# Patient Record
Sex: Male | Born: 1950 | Race: White | Hispanic: No | Smoking: Current some day smoker
Health system: Southern US, Community
[De-identification: ages and names within clinical notes are randomized; demographics above are authoritative.]

## PROBLEM LIST (undated history)

## (undated) HISTORY — PX: ANKLE RECONSTRUCTION: SHX1151

---

## 2018-02-08 ENCOUNTER — Emergency Department: Payer: Self-pay

## 2018-02-08 ENCOUNTER — Other Ambulatory Visit: Payer: Self-pay

## 2018-02-08 ENCOUNTER — Emergency Department
Admission: EM | Admit: 2018-02-08 | Discharge: 2018-02-08 | Disposition: A | Discharge status: Home / Self Care | Payer: Self-pay | Attending: Emergency Medicine | Admitting: Emergency Medicine

## 2018-02-08 DIAGNOSIS — J189 Pneumonia, unspecified organism: Secondary | ICD-10-CM

## 2018-02-08 DIAGNOSIS — J181 Lobar pneumonia, unspecified organism: Secondary | ICD-10-CM | POA: Insufficient documentation

## 2018-02-08 LAB — BASIC METABOLIC PANEL
ANION GAP: 8 (ref 5–15)
Anion gap: 7 (ref 5–15)
BUN: 25 mg/dL — ABNORMAL HIGH (ref 6–20)
BUN: 24 mg/dL — ABNORMAL HIGH (ref 6–20)
CALCIUM: 8.3 mg/dL — AB (ref 8.9–10.3)
CHLORIDE: 98 mmol/L — AB (ref 101–111)
CHLORIDE: 100 mmol/L — AB (ref 101–111)
CO2: 25 mmol/L (ref 22–32)
CO2: 21 mmol/L — ABNORMAL LOW (ref 22–32)
Calcium: 7.6 mg/dL — ABNORMAL LOW (ref 8.9–10.3)
Creatinine, Ser: 1.4 mg/dL — ABNORMAL HIGH (ref 0.61–1.24)
Creatinine, Ser: 1.13 mg/dL (ref 0.61–1.24)
GFR calc Af Amer: >60 mL/min (ref >60)
GFR calc non Af Amer: 52 mL/min — ABNORMAL LOW (ref >60)
GFR, EST AFRICAN AMERICAN: 60 mL/min — AB (ref >60)
GLUCOSE: 105 mg/dL — AB (ref 65–99)
GLUCOSE: 95 mg/dL (ref 65–99)
POTASSIUM: 3.4 mmol/L — AB (ref 3.5–5.1)
Potassium: 3.7 mmol/L (ref 3.5–5.1)
Sodium: 130 mmol/L — ABNORMAL LOW (ref 135–145)
Sodium: 129 mmol/L — ABNORMAL LOW (ref 135–145)

## 2018-02-08 LAB — CBC
HCT: 40.6 % (ref 40.0–52.0)
HEMOGLOBIN: 13.9 g/dL (ref 13.0–18.0)
MCH: 33 pg (ref 26.0–34.0)
MCHC: 34.2 g/dL (ref 32.0–36.0)
MCV: 96.5 fL (ref 80.0–100.0)
Platelets: 256 10*3/uL (ref 150–440)
RBC: 4.2 MIL/uL — AB (ref 4.40–5.90)
RDW: 12.8 % (ref 11.5–14.5)
WBC: 6.8 10*3/uL (ref 3.8–10.6)

## 2018-02-08 LAB — TROPONIN I

## 2018-02-08 MED ORDER — DOXYCYCLINE HYCLATE 100 MG PO CAPS: 100.0000 mg | ORAL_CAPSULE | Freq: Two times a day (BID) | ORAL | 0 refills | Status: AC

## 2018-02-08 MED ORDER — AMOXICILLIN-POT CLAVULANATE 875-125 MG PO TABS: 1.0000 | ORAL_TABLET | Freq: Two times a day (BID) | ORAL | 0 refills | Status: AC

## 2018-02-08 MED ORDER — SODIUM CHLORIDE 0.9 % IV BOLUS
1000.0000 mL | Freq: Once | INTRAVENOUS | Status: AC
  Administered 2018-02-08: 1000 mL via INTRAVENOUS

## 2018-02-08 MED ORDER — SODIUM CHLORIDE 0.9 % IV SOLN
1.0000 g | Freq: Once | INTRAVENOUS | Status: AC
  Administered 2018-02-08: 1 g via INTRAVENOUS
  Filled 2018-02-08: qty 10

## 2018-02-08 MED ORDER — DOXYCYCLINE HYCLATE 100 MG PO TABS
100.0000 mg | ORAL_TABLET | Freq: Once | ORAL | Status: AC
  Administered 2018-02-08: 100 mg via ORAL
  Filled 2018-02-08: qty 1

## 2018-02-08 NOTE — ED Triage Notes (Signed)
Pt c/o increased SOB with cough and congestion for the past 2 weeks., pt states he has had watery diarrhea also.Marland Kitchen Pt c/o having tender abd muscles from all the coughing.Marland Kitchen

## 2018-02-08 NOTE — ED Provider Notes (Signed)
[facility] Emergency Department Provider Note  ____________________________________________  Time seen: Approximately 9:23 PM  I have reviewed the triage vital signs and the nursing notes.   HISTORY  Chief Complaint Shortness of Breath and Diarrhea   HPI Robert Woodward is a 67 y.o. male with h/o smoking who presents for evaluation of SOB, cough, and diarrhea. Patient reports 2 weeks of cough productive of white phlegm and shortness of breath which is present with exertion and resolves at rest. Patient reports feeling very weak and fatigued.  No shortness of breath at rest.  Patient reports having fever several days ago but not in the last few days.  Also has had several daily episodes of watery diarrhea x 2 weeks.  He reports nausea but no vomiting, no chest pain, no abdominal pain, no melena, no hematemesis or hematochezia.  No recent antibiotic use or history of C. difficile.  Patient recently moved here from [place].  He has not seen a doctor for most of his adult life.  Allergies Benadryl [diphenhydramine]  FH Heart failure - father  Social History Social History   Tobacco Use  . Smoking status: Not on file  Substance Use Topics  . Alcohol use: Not on file  . Drug use: Not on file    Review of Systems  Constitutional: + fever, generalized weakness Eyes: Negative for visual changes. ENT: Negative for sore throat. Neck: No neck pain  Cardiovascular: Negative for chest pain. Respiratory: + shortness of breath, cough Gastrointestinal: Negative for abdominal pain, vomiting. + diarrhea. Genitourinary: Negative for dysuria. Musculoskeletal: Negative for back pain. Skin: Negative for rash. Neurological: Negative for headaches, weakness or numbness. Psych: No SI or HI  ____________________________________________   PHYSICAL EXAM:  VITAL SIGNS: ED Triage Vitals  Enc Vitals Group     BP 02/08/18 1612 (!) 147/69     Pulse Rate 02/08/18 1612  95     Resp 02/08/18 1612 17     Temp 02/08/18 1612 99.1 F (37.3 C)     Temp Source 02/08/18 1612 Oral     SpO2 02/08/18 1612 93 %     Weight 02/08/18 1613 215 lb (97.5 kg)     Height 02/08/18 1613 5\' 11"  (1.803 m)     Head Circumference --      Peak Flow --      Pain Score 02/08/18 1612 0     Pain Loc --      Pain Edu? --      Excl. in [place]? --     Constitutional: Alert and oriented. Well appearing and in no apparent distress. HEENT:      Head: Normocephalic and atraumatic.         Eyes: Conjunctivae are normal. Sclera is non-icteric.       Mouth/Throat: Mucous membranes are moist.       Neck: Supple with no signs of meningismus. Cardiovascular: Regular rate and rhythm. No murmurs, gallops, or rubs. 2+ symmetrical distal pulses are present in all extremities. No JVD. Respiratory: Normal respiratory effort. Lungs are clear to auscultation bilaterally with crackles on the L base, no wheezes. Gastrointestinal: Soft, non tender, and non distended with positive bowel sounds. No rebound or guarding. Musculoskeletal: Nontender with normal range of motion in all extremities. No edema, cyanosis, or erythema of extremities. Neurologic: Normal speech and language. Face is symmetric. Moving all extremities. No gross focal neurologic deficits are appreciated. Skin: Skin is warm, dry and intact. No rash noted. Psychiatric: Mood and  affect are normal. Speech and behavior are normal.  ____________________________________________   LABS (all labs ordered are listed, but only abnormal results are displayed)  Labs Reviewed  BASIC METABOLIC PANEL - Abnormal; Notable for the following components:      Result Value   Sodium 130 (*)    Chloride 98 (*)    Glucose, Bld 105 (*)    BUN 25 (*)    Creatinine, Ser 1.40 (*)    Calcium 8.3 (*)    GFR calc non Af Amer 52 (*)    GFR calc Af Amer 60 (*)    All other components within normal limits  CBC - Abnormal; Notable for the following components:    RBC 4.20 (*)    All other components within normal limits  BASIC METABOLIC PANEL - Abnormal; Notable for the following components:   Sodium 129 (*)    Potassium 3.4 (*)    Chloride 100 (*)    CO2 21 (*)    BUN 24 (*)    Calcium 7.6 (*)    All other components within normal limits  TROPONIN I   ____________________________________________  EKG  ED ECG REPORT I, Rudene Re, the attending physician, personally viewed and interpreted this ECG.  Normal sinus rhythm, rate of 91, normal intervals, normal QRS and QTC, normal axis, no ST elevations or depressions.  Normal EKG.  No prior for comparison. ____________________________________________  RADIOLOGY  I have personally reviewed the images performed during this visit and I agree with the Radiologist's read.   Interpretation by Radiologist:  Dg Chest 2 View  Result Date: 02/08/2018 CLINICAL DATA:  Increased shortness of breath with cough and congestion over the past 2 weeks. EXAM: CHEST - 2 VIEW COMPARISON:  None. FINDINGS: The heart size and mediastinal contours are within normal limits. Normal pulmonary vascularity. Patchy consolidation in the left lower lobe. No pleural effusion or pneumothorax. No acute osseous abnormality. IMPRESSION: 1. Patchy consolidation in the left lower lobe, suspicious for pneumonia. Followup PA and lateral chest X-ray is recommended in 3-4 weeks following trial of antibiotic therapy to ensure resolution and exclude underlying malignancy. Electronically Signed   By: Titus Dubin M.D.   On: 02/08/2018 16:40     ____________________________________________   PROCEDURES  Procedure(s) performed: None Procedures Critical Care performed:  None ____________________________________________   INITIAL IMPRESSION / ASSESSMENT AND PLAN / ED COURSE   67 y.o. male with h/o smoking who presents for evaluation of SOB, cough, fatigue, fever, and diarrhea x 2 weeks.  Patient is well-appearing, no  distress, vital signs show low-grade temp of 99.1 but otherwise within normal limits, lung auscultation reveals crackles on the left basis, normal work of breathing and normal sats.  Chest x-ray concerning for left lower lobe pneumonia.  Labs showing normal white count of 6.8.  Creatinine of 1.40 with a GFR of 52.  Patient has no history of kidney dysfunction.  Troponin is within normal limits.  EKG with no evidence of ischemia.  Will give IV fluids, Rocephin and doxycycline for community-acquired pneumonia.  Will recheck creatinine and if that is improving plan to discharge home and referred to primary care doctor.    _________________________ 11:09 PM on 02/08/2018 -----------------------------------------  Creatinine improved with IVF. Patient remains extremely well appearing with no signs of sepsis. Will dc on augmentin and doxy, close f/u at [facility] clinic. Discussed return precautions with patient.  As part of my medical decision making, I reviewed the following data within the [facility]  Nursing notes reviewed and incorporated, Labs reviewed , EKG interpreted , Radiograph reviewed , Notes from prior ED visits and [place] Controlled Substance Database    Pertinent labs & imaging results that were available during my care of the patient were reviewed by me and considered in my medical decision making (see chart for details).    ____________________________________________   FINAL CLINICAL IMPRESSION(S) / ED DIAGNOSES  Final diagnoses:  Community acquired pneumonia of left lower lobe of lung ([place])      NEW MEDICATIONS STARTED DURING THIS VISIT:  ED Discharge Orders        Ordered    doxycycline (VIBRAMYCIN) 100 MG capsule  2 times daily     02/08/18 2241    amoxicillin-clavulanate (AUGMENTIN) 875-125 MG tablet  2 times daily     02/08/18 2241       Note:  This document was prepared using Dragon voice recognition software and may include unintentional dictation  errors.    Alfred Levins, [place], MD 02/08/18 [phone]

## 2018-08-04 ENCOUNTER — Emergency Department: Payer: Medicare Other

## 2018-08-04 ENCOUNTER — Inpatient Hospital Stay
Admission: EM | Admit: 2018-08-04 | Discharge: 2018-08-11 | DRG: 436 | Disposition: A | Discharge status: Hospice | Payer: Medicare Other | Attending: Internal Medicine | Admitting: Internal Medicine

## 2018-08-04 ENCOUNTER — Other Ambulatory Visit: Payer: Self-pay

## 2018-08-04 DIAGNOSIS — R402144 Coma scale, eyes open, spontaneous, 24 hours or more after hospital admission: Secondary | ICD-10-CM | POA: Diagnosis not present

## 2018-08-04 DIAGNOSIS — E860 Dehydration: Secondary | ICD-10-CM | POA: Diagnosis not present

## 2018-08-04 DIAGNOSIS — R160 Hepatomegaly, not elsewhere classified: Secondary | ICD-10-CM

## 2018-08-04 DIAGNOSIS — R200 Anesthesia of skin: Secondary | ICD-10-CM | POA: Diagnosis present

## 2018-08-04 DIAGNOSIS — E871 Hypo-osmolality and hyponatremia: Secondary | ICD-10-CM

## 2018-08-04 DIAGNOSIS — R402254 Coma scale, best verbal response, oriented, 24 hours or more after hospital admission: Secondary | ICD-10-CM | POA: Diagnosis not present

## 2018-08-04 DIAGNOSIS — Z515 Encounter for palliative care: Secondary | ICD-10-CM

## 2018-08-04 DIAGNOSIS — R7989 Other specified abnormal findings of blood chemistry: Secondary | ICD-10-CM

## 2018-08-04 DIAGNOSIS — K219 Gastro-esophageal reflux disease without esophagitis: Secondary | ICD-10-CM | POA: Diagnosis present

## 2018-08-04 DIAGNOSIS — Z91048 Other nonmedicinal substance allergy status: Secondary | ICD-10-CM

## 2018-08-04 DIAGNOSIS — I4581 Long QT syndrome: Secondary | ICD-10-CM | POA: Diagnosis present

## 2018-08-04 DIAGNOSIS — Z888 Allergy status to other drugs, medicaments and biological substances status: Secondary | ICD-10-CM

## 2018-08-04 DIAGNOSIS — I248 Other forms of acute ischemic heart disease: Secondary | ICD-10-CM | POA: Diagnosis present

## 2018-08-04 DIAGNOSIS — F17210 Nicotine dependence, cigarettes, uncomplicated: Secondary | ICD-10-CM | POA: Diagnosis present

## 2018-08-04 DIAGNOSIS — K766 Portal hypertension: Secondary | ICD-10-CM | POA: Diagnosis present

## 2018-08-04 DIAGNOSIS — R188 Other ascites: Secondary | ICD-10-CM

## 2018-08-04 DIAGNOSIS — R402364 Coma scale, best motor response, obeys commands, 24 hours or more after hospital admission: Secondary | ICD-10-CM | POA: Diagnosis not present

## 2018-08-04 DIAGNOSIS — Z7401 Bed confinement status: Secondary | ICD-10-CM

## 2018-08-04 DIAGNOSIS — Z7189 Other specified counseling: Secondary | ICD-10-CM

## 2018-08-04 DIAGNOSIS — C220 Liver cell carcinoma: Secondary | ICD-10-CM | POA: Diagnosis not present

## 2018-08-04 DIAGNOSIS — E872 Acidosis: Secondary | ICD-10-CM | POA: Diagnosis present

## 2018-08-04 DIAGNOSIS — R600 Localized edema: Secondary | ICD-10-CM | POA: Diagnosis present

## 2018-08-04 DIAGNOSIS — A419 Sepsis, unspecified organism: Secondary | ICD-10-CM | POA: Diagnosis present

## 2018-08-04 DIAGNOSIS — N5089 Other specified disorders of the male genital organs: Secondary | ICD-10-CM | POA: Diagnosis present

## 2018-08-04 DIAGNOSIS — R000 Tachycardia, unspecified: Secondary | ICD-10-CM | POA: Diagnosis present

## 2018-08-04 DIAGNOSIS — M79671 Pain in right foot: Secondary | ICD-10-CM | POA: Diagnosis not present

## 2018-08-04 DIAGNOSIS — K7031 Alcoholic cirrhosis of liver with ascites: Secondary | ICD-10-CM

## 2018-08-04 DIAGNOSIS — B182 Chronic viral hepatitis C: Secondary | ICD-10-CM | POA: Diagnosis present

## 2018-08-04 DIAGNOSIS — D638 Anemia in other chronic diseases classified elsewhere: Secondary | ICD-10-CM | POA: Diagnosis present

## 2018-08-04 DIAGNOSIS — Z560 Unemployment, unspecified: Secondary | ICD-10-CM

## 2018-08-04 DIAGNOSIS — E876 Hypokalemia: Secondary | ICD-10-CM | POA: Diagnosis not present

## 2018-08-04 DIAGNOSIS — N179 Acute kidney failure, unspecified: Secondary | ICD-10-CM | POA: Diagnosis not present

## 2018-08-04 DIAGNOSIS — F1020 Alcohol dependence, uncomplicated: Secondary | ICD-10-CM | POA: Diagnosis present

## 2018-08-04 DIAGNOSIS — Z66 Do not resuscitate: Secondary | ICD-10-CM | POA: Diagnosis present

## 2018-08-04 DIAGNOSIS — R778 Other specified abnormalities of plasma proteins: Secondary | ICD-10-CM

## 2018-08-04 DIAGNOSIS — K5900 Constipation, unspecified: Secondary | ICD-10-CM | POA: Diagnosis present

## 2018-08-04 LAB — LACTIC ACID, PLASMA: Lactic Acid, Venous: 4.4 mmol/L (ref 0.5–1.9)

## 2018-08-04 LAB — COMPREHENSIVE METABOLIC PANEL
ALK PHOS: 83 U/L (ref 38–126)
ALT: 39 U/L (ref 0–44)
AST: 94 U/L — AB (ref 15–41)
Albumin: 2.4 g/dL — ABNORMAL LOW (ref 3.5–5.0)
Anion gap: 12 (ref 5–15)
BUN: 27 mg/dL — ABNORMAL HIGH (ref 8–23)
CO2: 23 mmol/L (ref 22–32)
CREATININE: 1.24 mg/dL (ref 0.61–1.24)
Calcium: 8.2 mg/dL — ABNORMAL LOW (ref 8.9–10.3)
Chloride: 99 mmol/L (ref 98–111)
GFR calc Af Amer: >60 mL/min (ref >60)
GFR calc non Af Amer: 59 mL/min — ABNORMAL LOW (ref >60)
GLUCOSE: 125 mg/dL — AB (ref 70–99)
POTASSIUM: 3.9 mmol/L (ref 3.5–5.1)
Sodium: 134 mmol/L — ABNORMAL LOW (ref 135–145)
Total Bilirubin: 3.4 mg/dL — ABNORMAL HIGH (ref 0.3–1.2)
Total Protein: 6 g/dL — ABNORMAL LOW (ref 6.5–8.1)

## 2018-08-04 LAB — CBC
HEMATOCRIT: 29.3 % — AB (ref 40.0–52.0)
Hemoglobin: 9.7 g/dL — ABNORMAL LOW (ref 13.0–18.0)
MCH: 30.6 pg (ref 26.0–34.0)
MCHC: 33.1 g/dL (ref 32.0–36.0)
MCV: 92.2 fL (ref 80.0–100.0)
Platelets: 254 10*3/uL (ref 150–440)
RBC: 3.18 MIL/uL — AB (ref 4.40–5.90)
RDW: 14.6 % — ABNORMAL HIGH (ref 11.5–14.5)
WBC: 12.2 10*3/uL — ABNORMAL HIGH (ref 3.8–10.6)

## 2018-08-04 LAB — BLOOD GAS, VENOUS
Acid-base deficit: 3.9 mmol/L — ABNORMAL HIGH (ref 0.0–2.0)
Bicarbonate: 21.6 mmol/L (ref 20.0–28.0)
O2 Saturation: 57 %
PATIENT TEMPERATURE: 37
pCO2, Ven: 40 mmHg — ABNORMAL LOW (ref 44.0–60.0)
pH, Ven: 7.34 (ref 7.250–7.430)
pO2, Ven: 32 mmHg (ref 32.0–45.0)

## 2018-08-04 LAB — TROPONIN I: Troponin I: 0.14 ng/mL (ref <0.03)

## 2018-08-04 LAB — BRAIN NATRIURETIC PEPTIDE: B Natriuretic Peptide: 63 pg/mL (ref 0.0–100.0)

## 2018-08-04 MED ORDER — SODIUM CHLORIDE 0.9 % IV BOLUS
1000.0000 mL | Freq: Once | INTRAVENOUS | Status: AC
  Administered 2018-08-04: 1000 mL via INTRAVENOUS

## 2018-08-04 MED ORDER — SODIUM CHLORIDE 0.9 % IV BOLUS
500.0000 mL | Freq: Once | INTRAVENOUS | Status: AC
  Administered 2018-08-04: 500 mL via INTRAVENOUS

## 2018-08-04 MED ORDER — ASPIRIN 81 MG PO CHEW: 324.0000 mg | CHEWABLE_TABLET | Freq: Once | ORAL | Status: DC

## 2018-08-04 MED ORDER — IOPAMIDOL (ISOVUE-300) INJECTION 61%
100.0000 mL | Freq: Once | INTRAVENOUS | Status: AC | PRN
  Administered 2018-08-04: 100 mL via INTRAVENOUS

## 2018-08-04 NOTE — ED Notes (Signed)
Date and time results received: 08/04/18 2123 (use smartphrase ".now" to insert current time)  Test: Lactic Acid  Critical Value: 4.4   Name of Provider Notified: MD Mariea Clonts   Orders Received? Or Actions Taken?: No orders received

## 2018-08-04 NOTE — ED Notes (Signed)
Patient reminded that urine specimen is needed patient unable to void at this time. Patient reported he would alert RN when able to provide a specimen.

## 2018-08-04 NOTE — ED Notes (Signed)
Pt states that he has not been able to ambulate for the past 2 days.

## 2018-08-04 NOTE — ED Notes (Signed)
This RN present for rectal exam for MD Mariea Clonts

## 2018-08-04 NOTE — ED Provider Notes (Addendum)
[facility] Emergency Department Provider Note  ____________________________________________  Time seen: Approximately 8:28 PM  I have reviewed the triage vital signs and the nursing notes.   HISTORY  Chief Complaint Shortness of Breath; Leg Swelling (Generalized edema); and Ascites    HPI Robert Woodward is a 67 y.o. male who does not regularly visit a physician presenting with inability to get out of bed.  The patient reports that for the past several months, he has had a progressively distending abdomen with upper extremity and facial wasting.  In addition, he has had new symmetric bilateral lower extremity edema, scrotal swelling, and progressively worsening dyspnea on exertion.  He denies any chest pain.  He has had decreased exercise tolerance.  He denies any nausea vomiting or diarrhea but has had decreased appetite.  No urinary symptoms, fevers or chills.  Patient was a smoker remotely and occasionally drinks alcohol; he has no personal history of cancer.  History reviewed. No pertinent past medical history.  There are no active problems to display for this patient.       Allergies Benadryl [diphenhydramine] and Wool alcohol [lanolin]  History reviewed. No pertinent family history.  Social History Social History   Tobacco Use  . Smoking status: Not on file  Substance Use Topics  . Alcohol use: Not on file  . Drug use: Not on file    Review of Systems Constitutional: No fever/chills.  Positive inability to get out of bed due to dyspnea and generalized weakness.  Positive muscle wasting in the upper extremities and face. Eyes: No visual changes. ENT: No sore throat. No congestion or rhinorrhea. Cardiovascular: Denies chest pain. Denies palpitations. Respiratory: Positive progressively worsening exertional shortness of breath.  No cough. Gastrointestinal: Positive abdominal distention without focal abdominal pain.  No nausea, no vomiting.  No  diarrhea.  Positive decreased appetite.  No constipation. Genitourinary: Negative for dysuria.  Positive scrotal swelling. Musculoskeletal: Negative for back pain. Skin: Negative for rash.  Positive jaundice. Neurological: Negative for headaches. No focal numbness, tingling or weakness.     ____________________________________________   PHYSICAL EXAM:  VITAL SIGNS: ED Triage Vitals  Enc Vitals Group     BP 08/04/18 1858 108/83     Pulse Rate 08/04/18 1858 (!) 111     Resp 08/04/18 1858 (!) 23     Temp 08/04/18 1858 98.3 F (36.8 C)     Temp Source 08/04/18 1858 Oral     SpO2 08/04/18 1858 99 %     Weight 08/04/18 1905 255 lb 4.8 oz (115.8 kg)     Height 08/04/18 1905 5\' 11"  (1.803 m)     Head Circumference --      Peak Flow --      Pain Score 08/04/18 1858 0     Pain Loc --      Pain Edu? --      Excl. in [place]? --     Constitutional: Alert and oriented.  Answers questions appropriately.  The patient is chronically ill-appearing with temporal wasting, diffuse wasting of the upper extremity muscles, jaundice. Eyes: Conjunctivae are normal.  EOMI. Positive scleral icterus. Head: Atraumatic. Nose: No congestion/rhinnorhea. Mouth/Throat: Mucous membranes are very dry.  Neck: No stridor.  Supple.  Positive JVD.  No meningismus. Cardiovascular: Fast rate, regular rhythm. No murmurs, rubs or gallops.  Respiratory: Tachypneic with accessory muscle use but no retractions.  Lungs CTAB.  No wheezes, rales or ronchi. Gastrointestinal: Soft, and grossly distended with fluid wave.  No focal  tenderness to palpation.  No guarding or rebound.  No peritoneal signs. GU: Positive diffuse scrotal swelling.  Large nonthrombosed nonbleeding hemorrhoid at 6:00, no palpable internal hemorrhoids, with brown stool that is guaiac positive. Musculoskeletal: Positive bilateral symmetric lower extremity edema that is pitting to the mid thighs. Neurologic:  A&Ox3.  Speech is clear.  Face and smile are  symmetric.  EOMI.  Moves all extremities well. Skin:  Skin is warm, dry and intact. No rash noted. Psychiatric: Mood and affect are normal.   ____________________________________________   LABS (all labs ordered are listed, but only abnormal results are displayed)  Labs Reviewed  CBC - Abnormal; Notable for the following components:      Result Value   WBC 12.2 (*)    RBC 3.18 (*)    Hemoglobin 9.7 (*)    HCT 29.3 (*)    RDW 14.6 (*)    All other components within normal limits  TROPONIN I - Abnormal; Notable for the following components:   Troponin I 0.14 (*)    All other components within normal limits  COMPREHENSIVE METABOLIC PANEL - Abnormal; Notable for the following components:   Sodium 134 (*)    Glucose, Bld 125 (*)    BUN 27 (*)    Calcium 8.2 (*)    Total Protein 6.0 (*)    Albumin 2.4 (*)    AST 94 (*)    Total Bilirubin 3.4 (*)    GFR calc non Af Amer 59 (*)    All other components within normal limits  BLOOD GAS, VENOUS - Abnormal; Notable for the following components:   pCO2, Ven 40 (*)    Acid-base deficit 3.9 (*)    All other components within normal limits  LACTIC ACID, PLASMA - Abnormal; Notable for the following components:   Lactic Acid, Venous 4.4 (*)    All other components within normal limits  BRAIN NATRIURETIC PEPTIDE  URINALYSIS, COMPLETE (UACMP) WITH MICROSCOPIC  LACTIC ACID, PLASMA  PROTIME-INR  APTT   ____________________________________________  EKG  ED ECG REPORT I, Anne-Caroline Mariea Clonts, the attending physician, personally viewed and interpreted this ECG.   Date: 08/04/2018  EKG Time: 2124  Rate: 100  Rhythm: sinus tachycardia  Axis: normal  Intervals:prolonged QTc  ST&T Change: No STEMI  ____________________________________________  RADIOLOGY  Dg Chest 2 View  Result Date: 08/04/2018 CLINICAL DATA:  Abdominal distension and diffuse body edema for the past 3 weeks. Dyspnea on exertion. EXAM: CHEST - 2 VIEW COMPARISON:   02/08/2018. FINDINGS: Normal sized heart. Clear lungs. Diffuse osteopenia. Mild thoracic spine degenerative changes. IMPRESSION: No acute abnormality. Electronically Signed   By: Claudie Revering M.D.   On: 08/04/2018 21:04   Ct Abdomen Pelvis W Contrast  Result Date: 08/04/2018 CLINICAL DATA:  Abdominal distension with swelling EXAM: CT ABDOMEN AND PELVIS WITH CONTRAST TECHNIQUE: Multidetector CT imaging of the abdomen and pelvis was performed using the standard protocol following bolus administration of intravenous contrast. CONTRAST:  172mL ISOVUE-300 IOPAMIDOL (ISOVUE-300) INJECTION 61% COMPARISON:  None. FINDINGS: Lower chest: Lung bases demonstrate no acute consolidation or pleural effusion. The heart size is within normal limits. Small moderate hiatal hernia with fluid in the hernia sac. Hepatobiliary: Nodular liver contour consistent with cirrhosis. Multiple suspicious liver masses measuring up to 8.6 cm in size. Calcified gallstones. No biliary dilatation Pancreas: Unremarkable. No pancreatic ductal dilatation or surrounding inflammatory changes. Spleen: Enlarged, coronal measurement of 15.5 cm. Vague hypodensity within the posterior spleen. Adrenals/Urinary Tract: Adrenal glands are within normal  limits. No hydronephrosis. Bladder within normal limits. Stomach/Bowel: No bowel dilatation or bowel wall thickening. Centralization of bowel loops. Negative appendix. Vascular/Lymphatic: Moderate aortic atherosclerosis. No aneurysmal dilatation. Subcentimeter gastrohepatic lymph nodes. Reproductive: Prostate is unremarkable. Other: No free air. Massive ascites in the abdomen and pelvis. Generalized subcutaneous edema Musculoskeletal: Chronic appearing superior endplate deformity at L1 with Schmorl's node. No suspicious bone lesion. IMPRESSION: 1. 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 2. Massive quantity of ascites within the  abdomen and pelvis 3. Splenomegaly. Vague hypodensity in the posterior spleen, not sure if this is due to heterogeneous enhancement or a vague hypodense mass in the spleen. 4. Gallstones 5. Anasarca Electronically Signed   By: Donavan Foil M.D.   On: 08/04/2018 22:20    ____________________________________________   PROCEDURES  Procedure(s) performed: None  Procedures  Critical Care performed: Yes, see critical care note(s) ____________________________________________   INITIAL IMPRESSION / ASSESSMENT AND PLAN / ED COURSE  Pertinent labs & imaging results that were available during my care of the patient were reviewed by me and considered in my medical decision making (see chart for details).  67 y.o. male presenting for inability to get out of bed today, with a progressively worsening course of bilateral lower extremity edema, scrotal edema, abdominal  distention and peripheral weight loss.  Overall, the patient is tachycardic which may be from hypovolemia and I have ordered intravenous fluids; we will start with 500 cc and reevaluate the patient.  I am concerned about malignancy in this patient given his jaundice and markedly distended abdomen, including hepatocellular carcinoma, pancreatic cancer, or gallbladder cancer.  The patient's laboratory studies are pending, including VBG and lactic acid.  We will also get a UTI.  CHF is possible but also less likely as a primary diagnosis today.  The patient will likely require admission to the hospital for further evaluation and treatment.  ----------------------------------------- 11:04 PM on 08/04/2018 -----------------------------------------  The patient CT scan does show a cirrhotic liver with multiple liver masses concerning for metastatic disease.  His bilirubin today is 3.4 and he has a mildly elevated AST at 94.  Additionally, he does have an elevated troponin of 0.14, without any ischemic changes on his EKG.  Given what is likely  malignancy, PE is considered, especially in the setting of a history of shortness of breath and a CT scan has been ordered.  The patient's creatinine today is 1.24, and I will give him additional fluids as this will be his second dye load.  The patient does have an elevated white blood cell count 12 with sinus tachycardia, but no obvious source of infection.  The CT scan with further evaluate for pneumonia, and a urinalysis is still pending.  These abnormalities may represent an acute on chronic illness from malignancy and the pt has been afebrile here and at home.  Patient does have a new anemia, which will be closely monitored; transfusion is not indicated emergently.  CRITICAL CARE Performed by: Eula Listen   Total critical care time: 45 minutes  Critical care time was exclusive of separately billable procedures and treating other patients.  Critical care was necessary to treat or prevent imminent or life-threatening deterioration.  Critical care was time spent personally by me on the following activities: development of treatment plan with patient and/or surrogate as well as nursing, discussions with consultants, evaluation of patient's response to treatment, examination of patient, obtaining history from patient or surrogate, ordering and  performing treatments and interventions, ordering and review of laboratory studies, ordering and review of radiographic studies, pulse oximetry and re-evaluation of patient's condition.    ____________________________________________  FINAL CLINICAL IMPRESSION(S) / ED DIAGNOSES  Final diagnoses:  Hyponatremia  Hyperbilirubinemia  Liver masses  Elevated troponin         NEW MEDICATIONS STARTED DURING THIS VISIT:  New Prescriptions   No medications on file      Eula Listen, MD 08/04/18 0569    Eula Listen, MD 08/04/18 [phone]

## 2018-08-04 NOTE — ED Notes (Signed)
Date and time results received: 08/04/18 2138 (use smartphrase ".now" to insert current time)  Test: Troponin  Critical Value: 0.14  Name of Provider Notified: MD Mariea Clonts   Orders Received? Or Actions Taken?:No orders received

## 2018-08-04 NOTE — ED Triage Notes (Signed)
Pt brought in by EMS. Pt states inc in generalized edema over past few weeks. Abd distented; groin swollen; legs edematous; BG 165 per EMS. Pt came in today because he "couldn't get out of bed because it hurt too bad." States he is dyspneic on exertion.

## 2018-08-04 NOTE — ED Notes (Signed)
Pt c/o numbness to feet and tingling in left thigh

## 2018-08-04 NOTE — ED Notes (Signed)
ED Provider at bedside. 

## 2018-08-04 NOTE — ED Notes (Signed)
Patient transported to CT 

## 2018-08-05 ENCOUNTER — Inpatient Hospital Stay: Payer: Medicare Other

## 2018-08-05 ENCOUNTER — Encounter: Payer: Self-pay | Admitting: *Deleted

## 2018-08-05 ENCOUNTER — Inpatient Hospital Stay
Admit: 2018-08-05 | Discharge: 2018-08-05 | Disposition: A | Discharge status: Home / Self Care | Payer: Medicare Other | Attending: Internal Medicine | Admitting: Internal Medicine

## 2018-08-05 DIAGNOSIS — D638 Anemia in other chronic diseases classified elsewhere: Secondary | ICD-10-CM | POA: Diagnosis present

## 2018-08-05 DIAGNOSIS — B182 Chronic viral hepatitis C: Secondary | ICD-10-CM | POA: Diagnosis present

## 2018-08-05 DIAGNOSIS — K219 Gastro-esophageal reflux disease without esophagitis: Secondary | ICD-10-CM | POA: Diagnosis present

## 2018-08-05 DIAGNOSIS — N179 Acute kidney failure, unspecified: Secondary | ICD-10-CM | POA: Diagnosis not present

## 2018-08-05 DIAGNOSIS — N5089 Other specified disorders of the male genital organs: Secondary | ICD-10-CM

## 2018-08-05 DIAGNOSIS — K766 Portal hypertension: Secondary | ICD-10-CM | POA: Diagnosis present

## 2018-08-05 DIAGNOSIS — A419 Sepsis, unspecified organism: Secondary | ICD-10-CM | POA: Diagnosis present

## 2018-08-05 DIAGNOSIS — F1020 Alcohol dependence, uncomplicated: Secondary | ICD-10-CM | POA: Diagnosis present

## 2018-08-05 DIAGNOSIS — F17210 Nicotine dependence, cigarettes, uncomplicated: Secondary | ICD-10-CM

## 2018-08-05 DIAGNOSIS — R402144 Coma scale, eyes open, spontaneous, 24 hours or more after hospital admission: Secondary | ICD-10-CM | POA: Diagnosis not present

## 2018-08-05 DIAGNOSIS — Z7289 Other problems related to lifestyle: Secondary | ICD-10-CM | POA: Diagnosis not present

## 2018-08-05 DIAGNOSIS — E876 Hypokalemia: Secondary | ICD-10-CM | POA: Diagnosis not present

## 2018-08-05 DIAGNOSIS — Z66 Do not resuscitate: Secondary | ICD-10-CM | POA: Diagnosis present

## 2018-08-05 DIAGNOSIS — Z515 Encounter for palliative care: Secondary | ICD-10-CM

## 2018-08-05 DIAGNOSIS — K7031 Alcoholic cirrhosis of liver with ascites: Secondary | ICD-10-CM | POA: Diagnosis present

## 2018-08-05 DIAGNOSIS — M79671 Pain in right foot: Secondary | ICD-10-CM | POA: Diagnosis not present

## 2018-08-05 DIAGNOSIS — R402364 Coma scale, best motor response, obeys commands, 24 hours or more after hospital admission: Secondary | ICD-10-CM | POA: Diagnosis not present

## 2018-08-05 DIAGNOSIS — E860 Dehydration: Secondary | ICD-10-CM | POA: Diagnosis not present

## 2018-08-05 DIAGNOSIS — R402254 Coma scale, best verbal response, oriented, 24 hours or more after hospital admission: Secondary | ICD-10-CM | POA: Diagnosis not present

## 2018-08-05 DIAGNOSIS — K5900 Constipation, unspecified: Secondary | ICD-10-CM | POA: Diagnosis present

## 2018-08-05 DIAGNOSIS — C220 Liver cell carcinoma: Secondary | ICD-10-CM | POA: Diagnosis present

## 2018-08-05 DIAGNOSIS — R160 Hepatomegaly, not elsewhere classified: Secondary | ICD-10-CM

## 2018-08-05 DIAGNOSIS — E872 Acidosis: Secondary | ICD-10-CM | POA: Diagnosis present

## 2018-08-05 DIAGNOSIS — R600 Localized edema: Secondary | ICD-10-CM | POA: Diagnosis present

## 2018-08-05 DIAGNOSIS — I248 Other forms of acute ischemic heart disease: Secondary | ICD-10-CM | POA: Diagnosis present

## 2018-08-05 DIAGNOSIS — E871 Hypo-osmolality and hyponatremia: Secondary | ICD-10-CM | POA: Diagnosis present

## 2018-08-05 LAB — URINALYSIS, COMPLETE (UACMP) WITH MICROSCOPIC
Bacteria, UA: NONE SEEN
Bilirubin Urine: NEGATIVE
Glucose, UA: NEGATIVE mg/dL
Ketones, ur: NEGATIVE mg/dL
Leukocytes, UA: NEGATIVE
Nitrite: NEGATIVE
Protein, ur: NEGATIVE mg/dL
Specific Gravity, Urine: >1.046 — ABNORMAL HIGH (ref 1.005–1.030)
pH: 5 (ref 5.0–8.0)

## 2018-08-05 LAB — PROTIME-INR
INR: 1.42
Prothrombin Time: 17.2 seconds — ABNORMAL HIGH (ref 11.4–15.2)

## 2018-08-05 LAB — BODY FLUID CELL COUNT WITH DIFFERENTIAL
EOS FL: 0 %
LYMPHS FL: 4 %
Monocyte-Macrophage-Serous Fluid: 18 %
NEUTROPHIL FLUID: 78 %
WBC FLUID: 678 uL

## 2018-08-05 LAB — ALBUMIN, PLEURAL OR PERITONEAL FLUID

## 2018-08-05 LAB — TSH: TSH: 2.194 u[IU]/mL (ref 0.350–4.500)

## 2018-08-05 LAB — TROPONIN I
Troponin I: <0.03 ng/mL (ref <0.03)
Troponin I: 0.04 ng/mL (ref <0.03)
Troponin I: 0.03 ng/mL (ref <0.03)

## 2018-08-05 LAB — LACTIC ACID, PLASMA: Lactic Acid, Venous: 4.7 mmol/L (ref 0.5–1.9)

## 2018-08-05 LAB — APTT: aPTT: 33 seconds (ref 24–36)

## 2018-08-05 LAB — POCT I-STAT CREATININE: Creatinine, Ser: 1.2 mg/dL (ref 0.61–1.24)

## 2018-08-05 MED ORDER — SODIUM CHLORIDE 0.9 % IV SOLN
2.0000 g | Freq: Every day | INTRAVENOUS | Status: DC
  Administered 2018-08-05 – 2018-08-08 (×4): 2 g via INTRAVENOUS
  Filled 2018-08-05 (×3): qty 2
  Filled 2018-08-05: qty 20

## 2018-08-05 MED ORDER — FUROSEMIDE 10 MG/ML IJ SOLN
60.0000 mg | Freq: Four times a day (QID) | INTRAMUSCULAR | Status: AC
  Administered 2018-08-05 (×2): 60 mg via INTRAVENOUS
  Filled 2018-08-05 (×2): qty 6

## 2018-08-05 MED ORDER — ONDANSETRON HCL 4 MG/2ML IJ SOLN
4.0000 mg | Freq: Four times a day (QID) | INTRAMUSCULAR | Status: DC | PRN
  Administered 2018-08-05 – 2018-08-07 (×3): 4 mg via INTRAVENOUS
  Filled 2018-08-05 (×3): qty 2

## 2018-08-05 MED ORDER — ACETAMINOPHEN 650 MG RE SUPP: 650.0000 mg | Freq: Four times a day (QID) | RECTAL | Status: DC | PRN

## 2018-08-05 MED ORDER — SODIUM CHLORIDE 0.9 % IV SOLN
1.0000 g | INTRAVENOUS | Status: DC
  Filled 2018-08-05: qty 10

## 2018-08-05 MED ORDER — PANTOPRAZOLE SODIUM 40 MG PO TBEC
40.0000 mg | DELAYED_RELEASE_TABLET | Freq: Every day | ORAL | Status: DC
  Administered 2018-08-06 – 2018-08-11 (×6): 40 mg via ORAL
  Filled 2018-08-05 (×7): qty 1

## 2018-08-05 MED ORDER — FUROSEMIDE 10 MG/ML IJ SOLN: 60.0000 mg | Freq: Once | INTRAMUSCULAR | Status: DC

## 2018-08-05 MED ORDER — DOCUSATE SODIUM 100 MG PO CAPS
100.0000 mg | ORAL_CAPSULE | Freq: Two times a day (BID) | ORAL | Status: DC
  Administered 2018-08-07 – 2018-08-11 (×8): 100 mg via ORAL
  Filled 2018-08-05 (×11): qty 1

## 2018-08-05 MED ORDER — CALCIUM CARBONATE ANTACID 500 MG PO CHEW
1.0000 | CHEWABLE_TABLET | ORAL | Status: DC | PRN
  Filled 2018-08-05 (×2): qty 1

## 2018-08-05 MED ORDER — SPIRONOLACTONE 25 MG PO TABS
100.0000 mg | ORAL_TABLET | Freq: Every day | ORAL | Status: DC
  Administered 2018-08-05 – 2018-08-11 (×7): 100 mg via ORAL
  Filled 2018-08-05 (×7): qty 4

## 2018-08-05 MED ORDER — ONDANSETRON HCL 4 MG PO TABS: 4.0000 mg | ORAL_TABLET | Freq: Four times a day (QID) | ORAL | Status: DC | PRN

## 2018-08-05 MED ORDER — ACETAMINOPHEN 325 MG PO TABS
650.0000 mg | ORAL_TABLET | Freq: Four times a day (QID) | ORAL | Status: DC | PRN
  Administered 2018-08-05 – 2018-08-08 (×2): 650 mg via ORAL
  Filled 2018-08-05 (×2): qty 2

## 2018-08-05 MED ORDER — ALBUMIN HUMAN 25 % IV SOLN
25.0000 g | Freq: Once | INTRAVENOUS | Status: AC
  Administered 2018-08-05: 25 g via INTRAVENOUS
  Filled 2018-08-05: qty 100
  Filled 2018-08-05: qty 500

## 2018-08-05 MED ORDER — IOHEXOL 300 MG/ML  SOLN
75.0000 mL | Freq: Once | INTRAMUSCULAR | Status: AC | PRN
  Administered 2018-08-05: 75 mL via INTRAVENOUS

## 2018-08-05 MED ORDER — GADOBUTROL 1 MMOL/ML IV SOLN
10.0000 mL | Freq: Once | INTRAVENOUS | Status: AC | PRN
  Administered 2018-08-05: 10 mL via INTRAVENOUS

## 2018-08-05 MED ORDER — SODIUM CHLORIDE 0.9 % IV SOLN
INTRAVENOUS | Status: DC
  Administered 2018-08-05: 05:00:00 via INTRAVENOUS

## 2018-08-05 MED ORDER — ENOXAPARIN SODIUM 40 MG/0.4ML ~~LOC~~ SOLN: 40.0000 mg | SUBCUTANEOUS | Status: DC

## 2018-08-05 NOTE — Consult Note (Addendum)
Hematology/Oncology Consult note [facility] [phone] [phone]  Patient Care Team: Patient, No Pcp Per as PCP - General (General Practice)   Name of the patient: Robert Woodward  [id number]  06/13/1951    Reason for consult: multiple liver masss   Referring physician- Dr. Vianne Bulls  Date of visit: 08/05/2018    History of presenting illness-patient is a 67 year old male with significant alcohol history.  He presented to the ER with symptoms of abdominal pain and scrotal swelling.  He has also developed progressive lower extremity edema which is making it difficult for him to walk.  He underwent CT abdomen and pelvis with contrast in the ER which showed cirrhosis of the liver with multiple hepatic masses largest up to 8.6 cm concerning for metastatic disease versus multifocal HCC and massive amounts of ascites and splenomegaly.  Anasarca.  CT chest showed no evidence of metastatic disease.  Possible hyperplastic thymic tissue adenopathy not excluded.  AFP is ordered and pending.  MRI of the abdomen with and without contrast showed large enhancing lesions in the background of cirrhosis suggestive of multifocal HCC.  Patient also had 6 L of ascitic fluid drained today.  CMP was significant for elevated bilirubin of 3.4 and a low albumin of 2.4.  He also has mildly elevated troponins lactic acidosis and he is being treated for possible sepsis.  Patient states that he has never been cathed before.  However his abdominal distention as well as scrotal swelling has been getting worse over the last couple of weeks that coupled with his leg swelling has made very difficult for him to ambulate.  Despite getting tapped today he continues to feel his abdomen is distended   ECOG PS- 3  Pain scale- 4   Review of systems- Review of Systems  Constitutional: Positive for malaise/fatigue. Negative for chills, fever and weight loss.  HENT: Negative for  congestion, ear discharge and nosebleeds.   Eyes: Negative for blurred vision.  Respiratory: Negative for cough, hemoptysis, sputum production, shortness of breath and wheezing.   Cardiovascular: Positive for leg swelling. Negative for chest pain, palpitations, orthopnea and claudication.  Gastrointestinal: Positive for abdominal pain. Negative for blood in stool, constipation, diarrhea, heartburn, melena, nausea and vomiting.       Abdominal distension, scrotal swelling  Genitourinary: Negative for dysuria, flank pain, frequency, hematuria and urgency.  Musculoskeletal: Negative for back pain, joint pain and myalgias.  Skin: Negative for rash.  Neurological: Negative for dizziness, tingling, focal weakness, seizures, weakness and headaches.  Endo/Heme/Allergies: Does not bruise/bleed easily.  Psychiatric/Behavioral: Negative for depression and suicidal ideas. The patient does not have insomnia.     Allergies  Allergen Reactions  . Benadryl [Diphenhydramine] Rash  . Wool Alcohol [Lanolin] Rash    Patient Active Problem List   Diagnosis Date Noted  . Sepsis ([place]) 08/05/2018     History reviewed. No pertinent past medical history.   Past Surgical History:  Procedure Laterality Date  . ANKLE RECONSTRUCTION      Social History   Socioeconomic History  . Marital status: Legally Separated    Spouse name: Not on file  . Number of children: Not on file  . Years of education: Not on file  . Highest education level: Not on file  Occupational History  . Not on file  Social Needs  . Financial resource strain: Not on file  . Food insecurity:    Worry: Not on file    Inability: Not on file  .  Transportation needs:    Medical: Not on file    Non-medical: Not on file  Tobacco Use  . Smoking status: Current Some Day Smoker    Types: Cigarettes  . Smokeless tobacco: Never Used  . Tobacco comment: a cigarette every 3 days  Substance and Sexual Activity  . Alcohol use: Not  Currently  . Drug use: Not Currently  . Sexual activity: Not on file  Lifestyle  . Physical activity:    Days per week: Not on file    Minutes per session: Not on file  . Stress: Not on file  Relationships  . Social connections:    Talks on phone: Not on file    Gets together: Not on file    Attends religious service: Not on file    Active member of club or organization: Not on file    Attends meetings of clubs or organizations: Not on file    Relationship status: Not on file  . Intimate partner violence:    Fear of current or ex partner: Not on file    Emotionally abused: Not on file    Physically abused: Not on file    Forced sexual activity: Not on file  Other Topics Concern  . Not on file  Social History Narrative  . Not on file     Family History  Problem Relation Age of Onset  . Diabetes Mellitus II Father   . Diabetes Mellitus II Sister   . Diabetes Mellitus II Brother      Current Facility-Administered Medications:  .  acetaminophen (TYLENOL) tablet 650 mg, 650 mg, Oral, Q6H PRN, 650 mg at 08/05/18 0458 **OR** acetaminophen (TYLENOL) suppository 650 mg, 650 mg, Rectal, Q6H PRN, Harrie Foreman, MD .  calcium carbonate (TUMS - dosed in mg elemental calcium) chewable tablet 200 mg of elemental calcium, 1 tablet, Oral, PRN, Harrie Foreman, MD .  cefTRIAXone (ROCEPHIN) 2 g in sodium chloride 0.9 % 100 mL IVPB, 2 g, Intravenous, Daily, Epifanio Lesches, MD, Stopped at 08/05/18 1219 .  docusate sodium (COLACE) capsule 100 mg, 100 mg, Oral, BID, Harrie Foreman, MD .  furosemide (LASIX) injection 60 mg, 60 mg, Intravenous, Q6H, Harrie Foreman, MD, 60 mg at 08/05/18 0732 .  ondansetron (ZOFRAN) tablet 4 mg, 4 mg, Oral, Q6H PRN **OR** ondansetron (ZOFRAN) injection 4 mg, 4 mg, Intravenous, Q6H PRN, Harrie Foreman, MD .  spironolactone (ALDACTONE) tablet 100 mg, 100 mg, Oral, Daily, Harrie Foreman, MD, 100 mg at 08/05/18 1322   Physical exam:    Vitals:   08/05/18 0434 08/05/18 0754 08/05/18 0944 08/05/18 1044  BP: (!) 153/100 (!) 165/96 (!) 137/93 (!) 145/86  Pulse: (!) 102 (!) 103 (!) 101 98  Resp: 18     Temp: 97.8 F (36.6 C) 97.7 F (36.5 C)    TempSrc: Oral Oral    SpO2: 100% 100% 98% 99%  Weight:      Height:       Physical Exam  Constitutional: He is oriented to person, place, and time.  Thin and appears fatigued  HENT:  Head: Normocephalic and atraumatic.  Eyes: Pupils are equal, round, and reactive to light. EOM are normal.  Neck: Normal range of motion.  Cardiovascular: Normal rate, regular rhythm and normal heart sounds.  Pulmonary/Chest: Effort normal and breath sounds normal.  Abdominal:  Abdomen is grossly distended. Ascites+. Scrotal swelling  Musculoskeletal: He exhibits edema (b/l +2 pitting edema all the way up to the  thighs).  Neurological: He is alert and oriented to person, place, and time.  Skin: Skin is warm and dry.       CMP Latest Ref Rng & Units 08/05/2018  Glucose 70 - 99 mg/dL -  BUN 8 - 23 mg/dL -  Creatinine 0.61 - 1.24 mg/dL 1.20  Sodium 135 - 145 mmol/L -  Potassium 3.5 - 5.1 mmol/L -  Chloride 98 - 111 mmol/L -  CO2 22 - 32 mmol/L -  Calcium 8.9 - 10.3 mg/dL -  Total Protein 6.5 - 8.1 g/dL -  Total Bilirubin 0.3 - 1.2 mg/dL -  Alkaline Phos 38 - 126 U/L -  AST 15 - 41 U/L -  ALT 0 - 44 U/L -   CBC Latest Ref Rng & Units 08/04/2018  WBC 3.8 - 10.6 K/uL 12.2(H)  Hemoglobin 13.0 - 18.0 g/dL 9.7(L)  Hematocrit 40.0 - 52.0 % 29.3(L)  Platelets 150 - 440 K/uL 254    @IMAGES @  Dg Chest 2 View  Result Date: 08/04/2018 CLINICAL DATA:  Abdominal distension and diffuse body edema for the past 3 weeks. Dyspnea on exertion. EXAM: CHEST - 2 VIEW COMPARISON:  02/08/2018. FINDINGS: Normal sized heart. Clear lungs. Diffuse osteopenia. Mild thoracic spine degenerative changes. IMPRESSION: No acute abnormality. Electronically Signed   By: Claudie Revering M.D.   On: 08/04/2018 21:04   Ct  Chest W Contrast  Result Date: 08/05/2018 CLINICAL DATA:  Liver cancer staging. Tightness and pain in the abdomen. EXAM: CT CHEST WITH CONTRAST TECHNIQUE: Multidetector CT imaging of the chest was performed during intravenous contrast administration. CONTRAST:  58mL OMNIPAQUE IOHEXOL 300 MG/ML  SOLN COMPARISON:  CT abdomen pelvis 08/04/2018. FINDINGS: Cardiovascular: Atherosclerotic calcification of the arterial vasculature, including three-vessel involvement of the coronary arteries. Heart size normal. No pericardial effusion. Mediastinum/Nodes: Triangular-shaped prevascular soft tissue lesion may represent thymus, measuring 1.5 cm in short axis. Additional mediastinal and hilar lymph nodes are subcentimeter in short axis size. No hilar or axillary adenopathy. There may be distal esophageal wall thickening. Lungs/Pleura: Minimal mucoid impaction in the left lower lobe. Lungs are otherwise clear. No pleural fluid. Adherent debris in the trachea. Upper Abdomen: Liver is shrunken and irregular and contains heterogeneous masses, better visualized on yesterday's exam. Upper abdominal lymph nodes measure up to 1.2 cm in the portacaval station. Gallstones. Visualized portions of the adrenal glands, kidneys, spleen, pancreas, stomach and bowel are otherwise grossly unremarkable. Large ascites. Musculoskeletal: Degenerative changes in the spine. Patchy sclerosis in the lateral aspects of the seventh ribs bilaterally, which may be posttraumatic in etiology, given symmetry. Additional age indeterminate fractures are seen in the right posterolateral eighth and ninth ribs. IMPRESSION: 1. No evidence of primary malignancy or metastatic disease in the chest. 2. Possible hyperplastic thymic tissue in the prevascular space. Adenopathy is not excluded. Continued attention on follow-up exams is warranted. 3. Cirrhosis with heterogeneous hepatic masses, better seen on 08/04/2018. 4. Large ascites. 5. Aortic atherosclerosis  (ICD10-170.0). Three-vessel coronary artery calcification. 6. Cholelithiasis. Electronically Signed   By: Lorin Picket M.D.   On: 08/05/2018 12:48   Mr Abdomen W Wo Contrast  Addendum Date: 08/05/2018   ADDENDUM REPORT: 08/05/2018 14:52 ADDENDUM: The large enhancing lesions have early arterial enhancement and washout typical of hepatocellular carcinoma (LI-RADS 5 categorization). Electronically Signed   By: Suzy Bouchard M.D.   On: 08/05/2018 14:52   Result Date: 08/05/2018 CLINICAL DATA:  Hepatic masses. EXAM: MRI ABDOMEN WITHOUT AND WITH CONTRAST TECHNIQUE: Multiplanar multisequence MR imaging of  the abdomen was performed both before and after the administration of intravenous contrast. CONTRAST:  7 mL Gadavist COMPARISON:  CT 08/04/2018 FINDINGS: Lower chest:  Lung bases are clear. Hepatobiliary: Multiple round masses in the liver are solid-appearing and demonstrate uniform arterial enhancement. Largest lesion the RIGHT hepatic lobe measures 8 cm (image 21/13). Smaller partially exophytic lesion from the posterolateral RIGHT hepatic lobe measures 3.7 cm. There is lesion in the LEFT lateral hepatic lobe measuring 6.0 cm. Large lesion in the posterior RIGHT lobe liver measuring 7.7 cm. Underlying liver itself is shrunken. There is nodular contour to the liver. The portal veins are patent. There is a large volume of intraperitoneal free fluid suggesting ascites. No biliary duct dilatation. Pancreas: No pancreatic lesion.  No pancreatic ductal dilatation. Spleen: Spleen is normal volume. Adrenals/urinary tract: Adrenal glands and kidneys are normal. Stomach/Bowel: Stomach and limited of the small bowel is unremarkable Vascular/Lymphatic: Abdominal aortic normal caliber. No retroperitoneal periportal lymphadenopathy. Musculoskeletal: No aggressive osseous lesion. Schmorl's node at L1. IMPRESSION: 1. Large round enhancing lesions on the background cirrhotic morphology of the liver is most suggestive of  multifocal hepatocellular carcinoma. 2. Large volume of intraperitoneal free fluid ascites associated with liver dysfunction. 3. Portal veins are patent.  Spleen normal volume. Electronically Signed: By: Suzy Bouchard M.D. On: 08/05/2018 13:11   Ct Abdomen Pelvis W Contrast  Result Date: 08/04/2018 CLINICAL DATA:  Abdominal distension with swelling EXAM: CT ABDOMEN AND PELVIS WITH CONTRAST TECHNIQUE: Multidetector CT imaging of the abdomen and pelvis was performed using the standard protocol following bolus administration of intravenous contrast. CONTRAST:  132mL ISOVUE-300 IOPAMIDOL (ISOVUE-300) INJECTION 61% COMPARISON:  None. FINDINGS: Lower chest: Lung bases demonstrate no acute consolidation or pleural effusion. The heart size is within normal limits. Small moderate hiatal hernia with fluid in the hernia sac. Hepatobiliary: Nodular liver contour consistent with cirrhosis. Multiple suspicious liver masses measuring up to 8.6 cm in size. Calcified gallstones. No biliary dilatation Pancreas: Unremarkable. No pancreatic ductal dilatation or surrounding inflammatory changes. Spleen: Enlarged, coronal measurement of 15.5 cm. Vague hypodensity within the posterior spleen. Adrenals/Urinary Tract: Adrenal glands are within normal limits. No hydronephrosis. Bladder within normal limits. Stomach/Bowel: No bowel dilatation or bowel wall thickening. Centralization of bowel loops. Negative appendix. Vascular/Lymphatic: Moderate aortic atherosclerosis. No aneurysmal dilatation. Subcentimeter gastrohepatic lymph nodes. Reproductive: Prostate is unremarkable. Other: No free air. Massive ascites in the abdomen and pelvis. Generalized subcutaneous edema Musculoskeletal: Chronic appearing superior endplate deformity at L1 with Schmorl's node. No suspicious bone lesion. IMPRESSION: 1. 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 2. Massive quantity  of ascites within the abdomen and pelvis 3. Splenomegaly. Vague hypodensity in the posterior spleen, not sure if this is due to heterogeneous enhancement or a vague hypodense mass in the spleen. 4. Gallstones 5. Anasarca Electronically Signed   By: Donavan Foil M.D.   On: 08/04/2018 22:20   US Paracentesis  Result Date: 08/05/2018 INDICATION: Patient with abdominal distention, ascites. Request is made for diagnostic and therapeutic paracentesis. EXAM: ULTRASOUND GUIDED DIAGNOSTIC AND THERAPEUTIC PARACENTESIS MEDICATIONS: 10 mL 1% lidocaine COMPLICATIONS: None immediate. PROCEDURE: Informed written consent was obtained from the patient after a discussion of the risks, benefits and alternatives to treatment. A timeout was performed prior to the initiation of the procedure. Initial ultrasound scanning demonstrates a large amount of ascites within the right lower abdominal quadrant. The right lower abdomen was prepped and draped in the usual sterile fashion. 1% lidocaine  was used for local anesthesia. Following this, a 6 Fr Safe-T-Centesis catheter was introduced. An ultrasound image was saved for documentation purposes. The paracentesis was performed. The catheter was removed and a dressing was applied. The patient tolerated the procedure well without immediate post procedural complication. FINDINGS: A total of approximately 6.0 liters of bloody fluid was removed. Samples were sent to the laboratory as requested by the clinical team. IMPRESSION: Successful ultrasound-guided diagnostic and therapeutic paracentesis yielding 6.0 liters of peritoneal fluid. Read by: Brynda Greathouse PA-C Electronically Signed   By: Lucrezia Europe M.D.   On: 08/05/2018 13:05    Assessment and plan- Patient is a 67 y.o. 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 With regards to liver masses: MRI abdomen suggests "suggestive of [place]" but not diagnostic by itself of [place]. There  is no other obvious primary source of malignancy. Given his alcohol history and underlying cirrhosis, this is highly suspicious for [place]. AFP is pending. He currently has significant ascites and liver biopsy is therefore challenging. I will discuss his case at tumor board next week if liver biopsy would be needed for tissue diagnosis or presumptive diagnosis of [place] can be made.Hepatitis workup is pending. Ascites fluid has been sent for cytology as well which may be positive if this is malignancy.  2. Regardless of underlying malignancy diagnosis, patient has evidence of cirrhosis and his calculated Child Pugh score is 10. He is therefore a Child Pugh C presently. Given his overall performance status, I do not think he is a candidate for systemic therapy. Hospice is a reasonable consideration at this time. I have asked Np V[facility] [employer] from palliative care to discuss further goals of care with him. He will continue to follow up with hm as an outpatient as well.  3. I will see him in my office next week and continue goals of care discussion with him. If his PS improves and his present decompensates cirrhosis can be medically controlled, perhaps treatment could be considered. This seems unlikely with his present clinical condition.   Total face to face encounter time for this patient visit was 45 min. >50% of the time was  spent in counseling and coordination of care.     Thank you for this kind referral and the opportunity to participate in the care of this  Patient   Visit Diagnosis 1. Hyponatremia   2. Hyperbilirubinemia   3. Liver masses   4. Elevated troponin   5. Ascites     Dr. Randa Evens, MD, MPH [facility] [phone] 08/05/2018

## 2018-08-05 NOTE — Care Management Note (Signed)
Case Management Note  Patient Details  Name: Robert Woodward MRN: [id number] Date of Birth: 1951/05/27  Subjective/Objective:       Patient admitted with swelling and sepsis.  He is from home; he lives with his sister.  Recently moved from [facility].   Has liver mass; oncology is being consulted.   He does not have a PCP or insurance.  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  Had paracentesis today; 6L of bloody fluid withdrawn.  States his sister is able to drive him to appointments.  Scheduled IV lasix, IV antibiotics.  Patient resting now.  RNCM brought in [facility] and [facility] application.  Sent in basket heads up to [employer] at [place].  Unsure of length of hospital stay at this time.                Action/Plan: Left message with Trecia Rogers, financial counseling  To see about completing a Medicaid application.   Expected Discharge Date:                  Expected Discharge Plan:     In-House Referral:     Discharge planning Services  CM Consult  Post Acute Care Choice:    Choice offered to:     DME Arranged:    DME Agency:     HH Arranged:    HH Agency:     Status of Service:  In process, will continue to follow  If discussed at Long Length of Stay Meetings, dates discussed:    Additional Comments:  Elza Rafter, RN 08/05/2018, 2:11 PM

## 2018-08-05 NOTE — H&P (Signed)
Robert Woodward is an 67 y.o. male.   Chief Complaint: Scrotal swelling HPI: The patient with no chronic medical problems presents emergency department complaining of abdominal pain and scrotal swelling.  The patient also admits to lower extremity edema and numbness in his feet.  He states that he has been in so much pain that has been unable to get out of bed the last 2 days.  He denies fever, nausea or vomiting.  In the emergency department patient was found to have a very tensely swollen abdomen.  CT of the abdomen revealed cirrhotic liver as well as multiple areas of hyperdensity concerning for metastases.  He was also found to have elevated troponin as well as significant lactic acidosis prompted the emergency department staff to the hospital service for admission.  History reviewed. No pertinent past medical history.  Past Surgical History:  Procedure Laterality Date  . ANKLE RECONSTRUCTION      Family History  Problem Relation Age of Onset  . Diabetes Mellitus II Father   . Diabetes Mellitus II Sister   . Diabetes Mellitus II Brother    Social History:  reports that he has been smoking cigarettes. He has never used smokeless tobacco. He reports that he drank alcohol. He reports that he has current or past drug history.  Allergies:  Allergies  Allergen Reactions  . Benadryl [Diphenhydramine] Rash  . Wool Alcohol [Lanolin] Rash    Medications Prior to Admission  Medication Sig Dispense Refill  . calcium carbonate (TUMS - DOSED IN MG ELEMENTAL CALCIUM) 500 MG chewable tablet Chew 1 tablet by mouth as needed for indigestion or heartburn.      Results for orders placed or performed during the hospital encounter of 08/04/18 (from the past 48 hour(s))  CBC     Status: Abnormal   Collection Time: 08/04/18  8:27 PM  Result Value Ref Range   WBC 12.2 (H) 3.8 - 10.6 K/uL   RBC 3.18 (L) 4.40 - 5.90 MIL/uL   Hemoglobin 9.7 (L) 13.0 - 18.0 g/dL   HCT 29.3 (L) 40.0 - 52.0 %   MCV 92.2  80.0 - 100.0 fL   MCH 30.6 26.0 - 34.0 pg   MCHC 33.1 32.0 - 36.0 g/dL   RDW 14.6 (H) 11.5 - 14.5 %   Platelets 254 150 - 440 K/uL    Comment: Performed at [facility], [address] [place], [place] [address]  Brain natriuretic peptide     Status: None   Collection Time: 08/04/18  8:27 PM  Result Value Ref Range   B Natriuretic Peptide 63.0 0.0 - 100.0 pg/mL    Comment: Performed at [facility], [address] [place], [place] [address]  Troponin I     Status: Abnormal   Collection Time: 08/04/18  8:27 PM  Result Value Ref Range   Troponin I 0.14 (HH) <0.03 ng/mL    Comment: CRITICAL RESULT CALLED TO, READ BACK BY AND VERIFIED WITH CHRISTINA JAMES 08/04/18 @ 2137  [place] Performed at [facility], [address] [place], [place] [address]   Comprehensive metabolic panel     Status: Abnormal   Collection Time: 08/04/18  8:27 PM  Result Value Ref Range   Sodium 134 (L) 135 - 145 mmol/L   Potassium 3.9 3.5 - 5.1 mmol/L   Chloride 99 98 - 111 mmol/L   CO2 23 22 - 32 mmol/L   Glucose, Bld 125 (H) 70 - 99 mg/dL   BUN 27 (H)  8 - 23 mg/dL   Creatinine, Ser 1.24 0.61 - 1.24 mg/dL   Calcium 8.2 (L) 8.9 - 10.3 mg/dL   Total Protein 6.0 (L) 6.5 - 8.1 g/dL   Albumin 2.4 (L) 3.5 - 5.0 g/dL   AST 94 (H) 15 - 41 U/L   ALT 39 0 - 44 U/L   Alkaline Phosphatase 83 38 - 126 U/L   Total Bilirubin 3.4 (H) 0.3 - 1.2 mg/dL   GFR calc non Af Amer 59 (L) >60 mL/min   GFR calc Af Amer >60 >60 mL/min    Comment: (NOTE) The eGFR has been calculated using the CKD EPI equation. This calculation has not been validated in all clinical situations. eGFR's persistently <60 mL/min signify possible Chronic Kidney Disease.    Anion gap 12 5 - 15    Comment: Performed at [facility], [place], [place] [address]  Lactic acid, plasma     Status: Abnormal   Collection Time: 08/04/18  8:41 PM  Result Value Ref Range   Lactic Acid, Venous 4.4 (HH) 0.5 -  1.9 mmol/L    Comment: CRITICAL RESULT CALLED TO, READ BACK BY AND VERIFIED WITH CHRISTINA JAMES 08/04/18 @ 2122  [place] Performed at [facility], [address] [place], [place] [address]   Blood gas, venous     Status: Abnormal   Collection Time: 08/04/18  9:17 PM  Result Value Ref Range   pH, Ven 7.34 7.250 - 7.430   pCO2, Ven 40 (L) 44.0 - 60.0 mmHg   pO2, Ven 32.0 32.0 - 45.0 mmHg   Bicarbonate 21.6 20.0 - 28.0 mmol/L   Acid-base deficit 3.9 (H) 0.0 - 2.0 mmol/L   O2 Saturation 57.0 %   Patient temperature 37.0    Collection site VEIN    Sample type VENOUS     Comment: Performed at [facility], [address] [place], [place] [address]  Lactic acid, plasma     Status: Abnormal   Collection Time: 08/04/18 11:35 PM  Result Value Ref Range   Lactic Acid, Venous 4.7 (HH) 0.5 - 1.9 mmol/L    Comment: CRITICAL RESULT CALLED TO, READ BACK BY AND VERIFIED WITH JENNA ROGERS @0009  08/04/18 [place] Performed at [facility], [address] [place], [place] [address]   Protime-INR     Status: Abnormal   Collection Time: 08/05/18  5:55 AM  Result Value Ref Range   Prothrombin Time 17.2 (H) 11.4 - 15.2 seconds   INR 1.42     Comment: Performed at [facility], [address] [place], [place] [address]  APTT     Status: None   Collection Time: 08/05/18  5:55 AM  Result Value Ref Range   aPTT 33 24 - 36 seconds    Comment: Performed at [facility], [address] [place], [place] [address]  TSH     Status: None   Collection Time: 08/05/18  5:55 AM  Result Value Ref Range   TSH 2.194 0.350 - 4.500 uIU/mL    Comment: Performed by a 3rd Generation assay with a functional sensitivity of <=0.01 uIU/mL. Performed at [facility], [address] [place], [place] [address]   Troponin I     Status: None   Collection Time: 08/05/18  5:55 AM  Result Value Ref Range   Troponin I <0.03 <0.03 ng/mL    Comment: Performed at [facility], [address], [place],  [address]  Dg Chest 2 View  Result Date: 08/04/2018 CLINICAL DATA:  Abdominal distension and diffuse body edema for the past 3 weeks. Dyspnea on exertion. EXAM: CHEST - 2 VIEW COMPARISON:  02/08/2018. FINDINGS: Normal sized heart. Clear lungs. Diffuse osteopenia. Mild thoracic spine degenerative changes. IMPRESSION: No acute abnormality. Electronically Signed   By: Claudie Revering M.D.   On: 08/04/2018 21:04   Ct Abdomen Pelvis W Contrast  Result Date: 08/04/2018 CLINICAL DATA:  Abdominal distension with swelling EXAM: CT ABDOMEN AND PELVIS WITH CONTRAST TECHNIQUE: Multidetector CT imaging of the abdomen and pelvis was performed using the standard protocol following bolus administration of intravenous contrast. CONTRAST:  139m ISOVUE-300 IOPAMIDOL (ISOVUE-300) INJECTION 61% COMPARISON:  None. FINDINGS: Lower chest: Lung bases demonstrate no acute consolidation or pleural effusion. The heart size is within normal limits. Small moderate hiatal hernia with fluid in the hernia sac. Hepatobiliary: Nodular liver contour consistent with cirrhosis. Multiple suspicious liver masses measuring up to 8.6 cm in size. Calcified gallstones. No biliary dilatation Pancreas: Unremarkable. No pancreatic ductal dilatation or surrounding inflammatory changes. Spleen: Enlarged, coronal measurement of 15.5 cm. Vague hypodensity within the posterior spleen. Adrenals/Urinary Tract: Adrenal glands are within normal limits. No hydronephrosis. Bladder within normal limits. Stomach/Bowel: No bowel dilatation or bowel wall thickening. Centralization of bowel loops. Negative appendix. Vascular/Lymphatic: Moderate aortic atherosclerosis. No aneurysmal dilatation. Subcentimeter gastrohepatic lymph nodes. Reproductive: Prostate is unremarkable. Other: No free air. Massive ascites in the abdomen and pelvis. Generalized subcutaneous edema Musculoskeletal: Chronic appearing superior endplate  deformity at L1 with Schmorl's node. No suspicious bone lesion. IMPRESSION: 1. 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 2. Massive quantity of ascites within the abdomen and pelvis 3. Splenomegaly. Vague hypodensity in the posterior spleen, not sure if this is due to heterogeneous enhancement or a vague hypodense mass in the spleen. 4. Gallstones 5. Anasarca Electronically Signed   By: KDonavan FoilM.D.   On: 08/04/2018 22:20    Review of Systems  Constitutional: Negative for chills and fever.  HENT: Negative for sore throat and tinnitus.   Eyes: Negative for blurred vision and redness.  Respiratory: Negative for cough and shortness of breath.   Cardiovascular: Positive for leg swelling. Negative for chest pain, palpitations, orthopnea and PND.  Gastrointestinal: Negative for abdominal pain, diarrhea and nausea.  Genitourinary: Negative for dysuria, frequency and urgency.  Musculoskeletal: Negative for joint pain and myalgias.  Skin: Negative for rash.       No lesions  Neurological: Negative for speech change, focal weakness and weakness.  Endo/Heme/Allergies: Does not bruise/bleed easily.       No temperature intolerance  Psychiatric/Behavioral: Negative for depression and suicidal ideas.    Blood pressure (!) 153/100, pulse (!) 102, temperature 97.8 F (36.6 C), temperature source Oral, resp. rate 18, height 5' 11"  (1.803 m), weight 115 kg, SpO2 100 %. Physical Exam  Vitals reviewed. Constitutional: He is oriented to person, place, and time. He appears well-developed and well-nourished. No distress.  HENT:  Head: Normocephalic and atraumatic.  Mouth/Throat: Oropharynx is clear and moist.  Eyes: Pupils are equal, round, and reactive to light. Conjunctivae and EOM are normal. No scleral icterus.  Neck: Normal range of motion. Neck supple. No JVD present. No tracheal deviation present. No thyromegaly  present.  Cardiovascular: Normal rate, regular rhythm and normal heart sounds. Exam reveals no gallop and no friction rub.  No murmur heard. Respiratory: Effort normal and breath sounds normal. No respiratory  distress.  GI: Soft. Bowel sounds are normal. He exhibits distension. There is no tenderness. There is no rebound.  Genitourinary:  Genitourinary Comments: Deferred  Musculoskeletal: Normal range of motion. He exhibits edema. He exhibits no tenderness.  Lymphadenopathy:    He has no cervical adenopathy.  Neurological: He is alert and oriented to person, place, and time. No cranial nerve deficit.  Skin: Skin is warm and dry. No erythema. There is pallor.  Psychiatric: He has a normal mood and affect. His behavior is normal. Judgment and thought content normal.     Assessment/Plan This is a 67 year old male admitted for sepsis. 1.  Sepsis: The patient meets criteria via tachycardia, leukocytosis, lactic acidosis and tachypnea.  He is hemodynamically stable.  Hydrate with intravenous fluid.  Follow blood cultures for growth and sensitivities. 2.  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  Evidence of portal hypertension as well. 3.  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  Consult oncology. 4.  Anasarca: With associated ascites.  Lasix and spironolactone and appropriate ratio to hopefully decrease distention and pain of the patient's abdomen and scrotum. 5.  DVT prophylaxis: Lovenox (may consider holding for biopsy and/or paracentesis) 6.  GI prophylaxis: None The patient is a full code.  Time spent on admission orders and patient care approximately 45 minutes  Harrie Foreman, MD 08/05/2018, 7:33 AM

## 2018-08-05 NOTE — Procedures (Signed)
PROCEDURE SUMMARY:  Successful US guided paracentesis from right lateral abdomen.  Yielded 6.0 liters of bloody fluid.  No immediate complications. Procedure was stopped prior to removal of all fluid due to this being patient's first paracentesis.  Patient with bloody fluid withdrawn.  Pt tolerated well. BP remained stable throughout.  Recommend patient receive albumin with procedure today.  Note consideration for biopsy of abdominal/liver masses. Fluid has been sent for cytology per requesting service.    Specimen was sent for labs.  Docia Barrier PA-C 08/05/2018 12:56 PM

## 2018-08-05 NOTE — Consult Note (Signed)
[place]  Telephone:([phone] [phone]   Name: Robert Woodward Date: 08/05/2018 MRN: [id number]  DOB: May 02, 1951  Patient Care Team: Patient, No Pcp Per as PCP - General (General Practice)    REASON FOR CONSULTATION: Palliative Care consult requested for this 67 y.o. male for goals of medical treatment in patient with history of chronic alcohol abuse since age 55 (stopped heavy drinking 2 years ago), who was admitted on 08/05/2018 with abdominal pain, anasarca, and possible sepsis.  Work-up including abdominal CT revealed cirrhosis and multiple hepatic masses concerning for metastatic disease or multifocal hepatocellular carcinoma.  Patient was also noted to have ascites and is status post large-volume paracentesis with 6 L of ascitic fluid drained.  Patient has been seen in consultation by oncology.  Palliative care has also been requested to help establish medical goals.   SOCIAL HISTORY:  Patient has been divorced twice and is not currently married.  He has no children. Patient lives at home with his sister.  Patient also has a brother who lives nearby. He moved here from [place] two years ago.  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    ADVANCE DIRECTIVES:  Patient does not currently have advance directives.  CODE STATUS: DNR  PAST MEDICAL HISTORY:History reviewed. No pertinent past medical history.  PAST SURGICAL HISTORY:  Past Surgical History:  Procedure Laterality Date  . ANKLE RECONSTRUCTION      HEMATOLOGY/ONCOLOGY HISTORY:   No history exists.    ALLERGIES:  is allergic to benadryl [diphenhydramine] and wool alcohol [lanolin].  MEDICATIONS:  Current Facility-Administered Medications  Medication Dose Route Frequency Provider Last Rate Last Dose  . acetaminophen (TYLENOL) tablet 650 mg  650 mg Oral Q6H PRN Harrie Foreman, MD   650 mg  at 08/05/18 0458   Or  . acetaminophen (TYLENOL) suppository 650 mg  650 mg Rectal Q6H PRN Harrie Foreman, MD      . calcium carbonate (TUMS - dosed in mg elemental calcium) chewable tablet 200 mg of elemental calcium  1 tablet Oral PRN Harrie Foreman, MD      . cefTRIAXone (ROCEPHIN) 2 g in sodium chloride 0.9 % 100 mL IVPB  2 g Intravenous Daily Epifanio Lesches, MD 200 mL/hr at 08/05/18 1542 2 g at 08/05/18 1542  . docusate sodium (COLACE) capsule 100 mg  100 mg Oral BID Harrie Foreman, MD      . furosemide (LASIX) injection 60 mg  60 mg Intravenous Q6H Harrie Foreman, MD   60 mg at 08/05/18 0732  . ondansetron (ZOFRAN) tablet 4 mg  4 mg Oral Q6H PRN Harrie Foreman, MD       Or  . ondansetron [facility]) injection 4 mg  4 mg Intravenous Q6H PRN Harrie Foreman, MD      . spironolactone (ALDACTONE) tablet 100 mg  100 mg Oral Daily Harrie Foreman, MD   100 mg at 08/05/18 1322    VITAL SIGNS: BP (!) 145/86 (BP Location: Right Arm)   Pulse 98   Temp 97.7 F (36.5 C) (Oral)   Resp 18   Ht 5' 11" (1.803 m)   Wt 253 lb 8.5 oz (115 kg)   SpO2 99%   BMI 35.36 kg/m  Filed Weights   08/04/18 1905 08/05/18 0434  Weight: 255 lb 4.8 oz (115.8 kg) 253 lb 8.5 oz (115 kg)    Estimated body  mass index is 35.36 kg/m as calculated from the following:   Height as of this encounter: 5' 11" (1.803 m).   Weight as of this encounter: 253 lb 8.5 oz (115 kg).  LABS: CBC:    Component Value Date/Time   WBC 12.2 (H) 08/04/2018 2027   HGB 9.7 (L) 08/04/2018 2027   HCT 29.3 (L) 08/04/2018 2027   PLT 254 08/04/2018 2027   MCV 92.2 08/04/2018 2027   Comprehensive Metabolic Panel:    Component Value Date/Time   NA 134 (L) 08/04/2018 2027   K 3.9 08/04/2018 2027   CL 99 08/04/2018 2027   CO2 23 08/04/2018 2027   BUN 27 (H) 08/04/2018 2027   CREATININE 1.20 08/05/2018 1127   GLUCOSE 125 (H) 08/04/2018 2027   CALCIUM 8.2 (L) 08/04/2018 2027   AST 94 (H) 08/04/2018 2027    ALT 39 08/04/2018 2027   ALKPHOS 83 08/04/2018 2027   BILITOT 3.4 (H) 08/04/2018 2027   PROT 6.0 (L) 08/04/2018 2027   ALBUMIN 2.4 (L) 08/04/2018 2027    RADIOGRAPHIC STUDIES: Dg Chest 2 View  Result Date: 08/04/2018 CLINICAL DATA:  Abdominal distension and diffuse body edema for the past 3 weeks. Dyspnea on exertion. EXAM: CHEST - 2 VIEW COMPARISON:  02/08/2018. FINDINGS: Normal sized heart. Clear lungs. Diffuse osteopenia. Mild thoracic spine degenerative changes. IMPRESSION: No acute abnormality. Electronically Signed   By: Claudie Revering M.D.   On: 08/04/2018 21:04   Ct Chest W Contrast  Result Date: 08/05/2018 CLINICAL DATA:  Liver cancer staging. Tightness and pain in the abdomen. EXAM: CT CHEST WITH CONTRAST TECHNIQUE: Multidetector CT imaging of the chest was performed during intravenous contrast administration. CONTRAST:  40m OMNIPAQUE IOHEXOL 300 MG/ML  SOLN COMPARISON:  CT abdomen pelvis 08/04/2018. FINDINGS: Cardiovascular: Atherosclerotic calcification of the arterial vasculature, including three-vessel involvement of the coronary arteries. Heart size normal. No pericardial effusion. Mediastinum/Nodes: Triangular-shaped prevascular soft tissue lesion may represent thymus, measuring 1.5 cm in short axis. Additional mediastinal and hilar lymph nodes are subcentimeter in short axis size. No hilar or axillary adenopathy. There may be distal esophageal wall thickening. Lungs/Pleura: Minimal mucoid impaction in the left lower lobe. Lungs are otherwise clear. No pleural fluid. Adherent debris in the trachea. Upper Abdomen: Liver is shrunken and irregular and contains heterogeneous masses, better visualized on yesterday's exam. Upper abdominal lymph nodes measure up to 1.2 cm in the portacaval station. Gallstones. Visualized portions of the adrenal glands, kidneys, spleen, pancreas, stomach and bowel are otherwise grossly unremarkable. Large ascites. Musculoskeletal: Degenerative changes in the  spine. Patchy sclerosis in the lateral aspects of the seventh ribs bilaterally, which may be posttraumatic in etiology, given symmetry. Additional age indeterminate fractures are seen in the right posterolateral eighth and ninth ribs. IMPRESSION: 1. No evidence of primary malignancy or metastatic disease in the chest. 2. Possible hyperplastic thymic tissue in the prevascular space. Adenopathy is not excluded. Continued attention on follow-up exams is warranted. 3. Cirrhosis with heterogeneous hepatic masses, better seen on 08/04/2018. 4. Large ascites. 5. Aortic atherosclerosis (ICD10-170.0). Three-vessel coronary artery calcification. 6. Cholelithiasis. Electronically Signed   By: MLorin PicketM.D.   On: 08/05/2018 12:48   Mr Abdomen W Wo Contrast  Addendum Date: 08/05/2018   ADDENDUM REPORT: 08/05/2018 14:52 ADDENDUM: The large enhancing lesions have early arterial enhancement and washout typical of hepatocellular carcinoma (LI-RADS 5 categorization). Electronically Signed   By: SSuzy BouchardM.D.   On: 08/05/2018 14:52   Result Date: 08/05/2018 CLINICAL DATA:  Hepatic masses. EXAM: MRI ABDOMEN WITHOUT AND WITH CONTRAST TECHNIQUE: Multiplanar multisequence MR imaging of the abdomen was performed both before and after the administration of intravenous contrast. CONTRAST:  7 mL Gadavist COMPARISON:  CT 08/04/2018 FINDINGS: Lower chest:  Lung bases are clear. Hepatobiliary: Multiple round masses in the liver are solid-appearing and demonstrate uniform arterial enhancement. Largest lesion the RIGHT hepatic lobe measures 8 cm (image 21/13). Smaller partially exophytic lesion from the posterolateral RIGHT hepatic lobe measures 3.7 cm. There is lesion in the LEFT lateral hepatic lobe measuring 6.0 cm. Large lesion in the posterior RIGHT lobe liver measuring 7.7 cm. Underlying liver itself is shrunken. There is nodular contour to the liver. The portal veins are patent. There is a large volume of  intraperitoneal free fluid suggesting ascites. No biliary duct dilatation. Pancreas: No pancreatic lesion.  No pancreatic ductal dilatation. Spleen: Spleen is normal volume. Adrenals/urinary tract: Adrenal glands and kidneys are normal. Stomach/Bowel: Stomach and limited of the small bowel is unremarkable Vascular/Lymphatic: Abdominal aortic normal caliber. No retroperitoneal periportal lymphadenopathy. Musculoskeletal: No aggressive osseous lesion. Schmorl's node at L1. IMPRESSION: 1. Large round enhancing lesions on the background cirrhotic morphology of the liver is most suggestive of multifocal hepatocellular carcinoma. 2. Large volume of intraperitoneal free fluid ascites associated with liver dysfunction. 3. Portal veins are patent.  Spleen normal volume. Electronically Signed: By: Suzy Bouchard M.D. On: 08/05/2018 13:11   Ct Abdomen Pelvis W Contrast  Result Date: 08/04/2018 CLINICAL DATA:  Abdominal distension with swelling EXAM: CT ABDOMEN AND PELVIS WITH CONTRAST TECHNIQUE: Multidetector CT imaging of the abdomen and pelvis was performed using the standard protocol following bolus administration of intravenous contrast. CONTRAST:  161m ISOVUE-300 IOPAMIDOL (ISOVUE-300) INJECTION 61% COMPARISON:  None. FINDINGS: Lower chest: Lung bases demonstrate no acute consolidation or pleural effusion. The heart size is within normal limits. Small moderate hiatal hernia with fluid in the hernia sac. Hepatobiliary: Nodular liver contour consistent with cirrhosis. Multiple suspicious liver masses measuring up to 8.6 cm in size. Calcified gallstones. No biliary dilatation Pancreas: Unremarkable. No pancreatic ductal dilatation or surrounding inflammatory changes. Spleen: Enlarged, coronal measurement of 15.5 cm. Vague hypodensity within the posterior spleen. Adrenals/Urinary Tract: Adrenal glands are within normal limits. No hydronephrosis. Bladder within normal limits. Stomach/Bowel: No bowel dilatation or bowel  wall thickening. Centralization of bowel loops. Negative appendix. Vascular/Lymphatic: Moderate aortic atherosclerosis. No aneurysmal dilatation. Subcentimeter gastrohepatic lymph nodes. Reproductive: Prostate is unremarkable. Other: No free air. Massive ascites in the abdomen and pelvis. Generalized subcutaneous edema Musculoskeletal: Chronic appearing superior endplate deformity at L1 with Schmorl's node. No suspicious bone lesion. IMPRESSION: 1. 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 2. Massive quantity of ascites within the abdomen and pelvis 3. Splenomegaly. Vague hypodensity in the posterior spleen, not sure if this is due to heterogeneous enhancement or a vague hypodense mass in the spleen. 4. Gallstones 5. Anasarca Electronically Signed   By: KDonavan FoilM.D.   On: 08/04/2018 22:20   [place]acent[place]  Result Date: 08/05/2018 INDICATION: Patient with abdominal distention, ascites. Request is made for diagnostic and therapeutic paracentesis. EXAM: ULTRASOUND GUIDED DIAGNOSTIC AND THERAPEUTIC PARACENTESIS MEDICATIONS: 10 mL 1% lidocaine COMPLICATIONS: None immediate. PROCEDURE: Informed written consent was obtained from the patient after a discussion of the risks, benefits and alternatives to treatment. A timeout was performed prior to the initiation of the procedure. Initial ultrasound scanning demonstrates a large amount of ascites within the right lower abdominal quadrant.  The right lower abdomen was prepped and draped in the usual sterile fashion. 1% lidocaine was used for local anesthesia. Following this, a 6 Fr Safe-T-Centesis catheter was introduced. An ultrasound image was saved for documentation purposes. The paracentesis was performed. The catheter was removed and a dressing was applied. The patient tolerated the procedure well without immediate post procedural complication. FINDINGS: A total of approximately 6.0  liters of bloody fluid was removed. Samples were sent to the laboratory as requested by the clinical team. IMPRESSION: Successful ultrasound-guided diagnostic and therapeutic paracentesis yielding 6.0 liters of peritoneal fluid. Read by: Brynda Greathouse PA-C Electronically Signed   By: Lucrezia Europe M.D.   On: 08/05/2018 13:05    PERFORMANCE STATUS (ECOG) : 3 - Symptomatic, >50% confined to bed  Review of Systems As noted above. Otherwise, a complete review of systems is negative.  Physical Exam General: frail appearing, NAD HEENT: Normocephalic Lungs: Clear to auscultation bilaterally. Heart: Regular rate and rhythm Abdomen: distended, + fluid wave Musculoskeletal: 3+ bilateral LE edema Neuro: Alert, answering all questions appropriately. Cranial nerves grossly intact. Skin: No rashes or petechiae noted. Psych: Normal affect.   IMPRESSION: I met with patient and his sister to discuss medical goals.  The patient was able to relate to me his conversation with Dr. Janese Banks including the probable diagnosis of cancer and limited treatment options.  We discussed at length the recommendation for hospice care at home and what that would entail.  However, patient seems somewhat overwhelmed with the prospect of decision-making given the recent information.  We will plan to follow-up with him on Monday to continue conversations regarding his goals.  At baseline, patient lives at home with his sister. He says that prior to recent decline over the past two weeks, he was functionally independent and ambulatory without issues. He has become increasingly weak and has had difficulty standing from a seated position. Some of this might be secondary to the accumulation of massive amounts of ascites. Although, he does also appear somewhat deconditioned.   Patient does not currently have advance directives.  However, he is interested in establishing both a healthcare power of attorney and a living will.  He would want  his sister to be his decision maker if needed.  I will ask the chaplain to assist [place] with completing these documents while he is in the hospital.  I discussed with patient his wishes for CODE STATUS.  Patient clearly states that he would not want to be resuscitated nor have his life prolonged artificially on machines.  Patient says he would want a DO NOT RESUSCITATE order.  His sister seem to agree with this decision.  Symptomatically, patient says he feels somewhat improved following the large volume paracentesis today.  His abdomen still appears markedly distended and patient will likely need repeat therapeutic paracentesis.  Patient says he has intermittent pain in the groin but none currently.  We talked about pain management but patient says he is fearful of taking medication.  Patient says he is having persistent gastric reflux and belching.  Will start PPI.  PLAN: 1. Continue supportive care 2. DNR 3. Chaplain to assist with establishing HCPOA and living will 4. Protonix 10m PO daily   Patient expressed understanding and was in agreement with this plan.    Time Total: 60 minutes  Visit consisted of counseling and education dealing with the complex and emotionally intense issues of symptom management and palliative care in the setting of serious and potentially life-threatening illness.Greater than  50%  of this time was spent counseling and coordinating care related to the above assessment and plan.  Signed by: Altha Harm, [place], NP-C, [place] (Work Cell)

## 2018-08-05 NOTE — Progress Notes (Signed)
Patient admitted for abdominal swelling, scrotal swelling that is going on for 3 months gotten worse last few days.  Patient denies any cough or fever and has no PCP and he denies any history of hep C.  Told me that he was a heavy drinker now is drinking that much. Labs, imaging reviewed Assessment and plan 1.  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  Recent CT abdomen showed massive ascites and splenomegaly, multiple hepatic masses, spoke with oncology who recommended MRI of abdomen before proceeding for biopsy, tumor markers are ordered for AFP, hepatitis panel also been ordered.  Patient does not have any known history of hep C. 2.  Sepsis on admission likely due to underlying liver mass, anasarca and cirrhosis, stopped IV fluids because he is already having significant ascites with anasarca.,  Start IV antibiotics to cover SBP also. #3, slightly elevated troponins likely due to demand ischemia with 1 and 2, check echocardiogram. Time spent 25 minutes

## 2018-08-05 NOTE — ED Notes (Signed)
Pt being transported to rm [id number] at this time by this tech.

## 2018-08-05 NOTE — Progress Notes (Signed)
*  PRELIMINARY RESULTS* Echocardiogram 2D Echocardiogram has been performed.  Robert Woodward 08/05/2018, 3:27 PM

## 2018-08-05 NOTE — Plan of Care (Signed)
  Problem: Coping: Goal: Level of anxiety will decrease Outcome: Progressing   Problem: Pain Managment: Goal: General experience of comfort will improve Outcome: Progressing Note:  Complaints of headache once, treated with tylenol   Problem: Safety: Goal: Ability to remain free from injury will improve Outcome: Progressing

## 2018-08-06 LAB — COMPREHENSIVE METABOLIC PANEL
ALBUMIN: 2.6 g/dL — AB (ref 3.5–5.0)
ALT: 38 U/L (ref 0–44)
AST: 82 U/L — ABNORMAL HIGH (ref 15–41)
Alkaline Phosphatase: 71 U/L (ref 38–126)
Anion gap: 7 (ref 5–15)
BUN: 29 mg/dL — AB (ref 8–23)
CHLORIDE: 100 mmol/L (ref 98–111)
CO2: 27 mmol/L (ref 22–32)
CREATININE: 1.37 mg/dL — AB (ref 0.61–1.24)
Calcium: 8 mg/dL — ABNORMAL LOW (ref 8.9–10.3)
GFR calc Af Amer: >60 mL/min (ref >60)
GFR calc non Af Amer: 53 mL/min — ABNORMAL LOW (ref >60)
Glucose, Bld: 125 mg/dL — ABNORMAL HIGH (ref 70–99)
POTASSIUM: 3.5 mmol/L (ref 3.5–5.1)
SODIUM: 134 mmol/L — AB (ref 135–145)
Total Bilirubin: 2.1 mg/dL — ABNORMAL HIGH (ref 0.3–1.2)
Total Protein: 5.5 g/dL — ABNORMAL LOW (ref 6.5–8.1)

## 2018-08-06 LAB — HIV ANTIBODY (ROUTINE TESTING W REFLEX): HIV Screen 4th Generation wRfx: NONREACTIVE

## 2018-08-06 LAB — HEPATITIS B SURFACE ANTIBODY,QUALITATIVE: Hep B S Ab: REACTIVE

## 2018-08-06 LAB — HEPATITIS B CORE ANTIBODY, TOTAL: HEP B C TOTAL AB: POSITIVE — AB

## 2018-08-06 LAB — AFP TUMOR MARKER: AFP, SERUM, TUMOR MARKER: 5.9 ng/mL (ref 0.0–8.3)

## 2018-08-06 LAB — HEPATITIS C ANTIBODY

## 2018-08-06 LAB — LACTIC ACID, PLASMA: Lactic Acid, Venous: 1.9 mmol/L (ref 0.5–1.9)

## 2018-08-06 MED ORDER — METOCLOPRAMIDE HCL 5 MG/ML IJ SOLN
10.0000 mg | Freq: Three times a day (TID) | INTRAMUSCULAR | Status: DC | PRN
  Administered 2018-08-06: 10 mg via INTRAVENOUS
  Filled 2018-08-06: qty 2

## 2018-08-06 NOTE — Progress Notes (Addendum)
[place] at [place] NAME: Robert Woodward    MR#:  [id number]  DATE OF BIRTH:  Oct 30, 1951  SUBJECTIVE:  CHIEF COMPLAINT:   Chief Complaint  Patient presents with  . Shortness of Breath  . Leg Swelling    Generalized edema  . Ascites   The patient has nausea and vomiting with dark brown emesis this morning. REVIEW OF SYSTEMS:  Review of Systems  Constitutional: Negative for chills, fever and malaise/fatigue.  HENT: Negative for sore throat.   Eyes: Negative for blurred vision and double vision.  Respiratory: Negative for cough, hemoptysis, shortness of breath, wheezing and stridor.   Cardiovascular: Positive for leg swelling. Negative for chest pain, palpitations and orthopnea.  Gastrointestinal: Positive for nausea and vomiting. Negative for abdominal pain, blood in stool, diarrhea and melena.       Abdominal distention.  Genitourinary: Negative for dysuria, flank pain and hematuria.  Musculoskeletal: Negative for back pain and joint pain.  Neurological: Negative for dizziness, sensory change, focal weakness, seizures, loss of consciousness, weakness and headaches.  Endo/Heme/Allergies: Negative for polydipsia.  Psychiatric/Behavioral: Negative for depression. The patient is not nervous/anxious.     DRUG ALLERGIES:   Allergies  Allergen Reactions  . Benadryl [Diphenhydramine] Rash  . Wool Alcohol [Lanolin] Rash   VITALS:  Blood pressure (!) 126/93, pulse 100, temperature 97.8 F (36.6 C), temperature source Oral, resp. rate 20, height 5\' 11"  (1.803 m), weight 109.5 kg, SpO2 100 %. PHYSICAL EXAMINATION:  Physical Exam  Constitutional: He is oriented to person, place, and time. No distress.  HENT:  Head: Normocephalic.  Mouth/Throat: Oropharynx is clear and moist.  Eyes: Pupils are equal, round, and reactive to light. Conjunctivae and EOM are normal. No scleral icterus.  Neck: Normal range of motion. Neck supple. No JVD  present. No tracheal deviation present.  Cardiovascular: Normal rate, regular rhythm and normal heart sounds. Exam reveals no gallop.  No murmur heard. Pulmonary/Chest: Effort normal and breath sounds normal. No respiratory distress. He has no wheezes. He has no rales.  Abdominal: Soft. Bowel sounds are normal. He exhibits distension. There is no tenderness. There is no rebound and no guarding.  Genitourinary:  Genitourinary Comments: Scrotal edema.  Musculoskeletal: Normal range of motion. He exhibits edema. He exhibits no tenderness.  Bilateral leg edema 2-3+.  Neurological: He is alert and oriented to person, place, and time. No cranial nerve deficit.  Skin: No rash noted. No erythema.  Psychiatric: He has a normal mood and affect.   LABORATORY PANEL:  Male CBC Recent Labs  Lab 08/04/18 2027  WBC 12.2*  HGB 9.7*  HCT 29.3*  PLT 254   ------------------------------------------------------------------------------------------------------------------ Chemistries  Recent Labs  Lab 08/06/18 0354  NA 134*  K 3.5  CL 100  CO2 27  GLUCOSE 125*  BUN 29*  CREATININE 1.37*  CALCIUM 8.0*  AST 82*  ALT 38  ALKPHOS 71  BILITOT 2.1*   RADIOLOGY:  No results found. ASSESSMENT AND PLAN:   This is a 67 year old male admitted for sepsis.  1.   Sepsis on admission likely due to underlying liver mass, anasarca and cirrhosis.  Hydrated with intravenous fluid which was discontinued due to anasarca and ascites. Continue Rocephin to cover SBP.  2.  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  Evidence of portal hypertension as well. Positive HCV antibody.  Ascites, possible malignant Status post paracentesis with 6 L drawl. Repeat paracentesis.  3.  Liver masses: Likely  metastases of hepatocellular carcinoma. suspicious for [facility], He currently has significant ascites and liver biopsy is therefore challenging per Consult oncology.  4.  Anasarca: With associated  ascites.  Continue Lasix and spironolactone. Hospice is a reasonable consideration at this time per Dr. Janese Banks.   5.  DVT prophylaxis: Lovenox is discontinued due to concerns of dark brown emesis.  Acute renal failure due to dehydration.  Possible due to diuretics.  Follow-up BMP. Lactic acidosis.  Improved.  All the records are reviewed and case discussed with Care Management/Social Worker. Management plans discussed with the patient, family and they are in agreement.  CODE STATUS: DNR  TOTAL TIME TAKING CARE OF THIS PATIENT: 36 minutes.   More than 50% of the time was spent in counseling/coordination of care: YES  POSSIBLE D/C IN 2-3 DAYS, DEPENDING ON CLINICAL CONDITION.   Demetrios Loll M.D on 08/06/2018 at 3:53 PM  Between 7am to 6pm - Pager - [phone]  After 6pm go to [email or web address] - [facility]

## 2018-08-06 NOTE — Progress Notes (Signed)
Concern for blood in emesis as patient is continually vomiting, dark brown emesis and has not eaten or drinking thus far this shift- Dr Bridgett Larsson made aware- Orders received for Reglan and D/C anticoags at this time.

## 2018-08-06 NOTE — Progress Notes (Signed)
Pt had light brown emesis consistent with the tea he had been drinking this evening around 2200. PRN Zofran given with relief noted. Pt having emesis again this a.m.Marland Kitchen Emesis is darker brown, pt denies nausea and states he gets hiccups and that is when he starts throwing up.

## 2018-08-06 NOTE — Plan of Care (Signed)
Nausea and Vomiting present today- started IV reglan with resolve- patient states that he feels better. Patient still edematous with distended and taut abdomen but denies any pain. Patient to go for additional paracentesis. MD has been in to update patient on plan of care including new diagnosis.

## 2018-08-07 LAB — BASIC METABOLIC PANEL
ANION GAP: 9 (ref 5–15)
BUN: 35 mg/dL — AB (ref 8–23)
CALCIUM: 8.1 mg/dL — AB (ref 8.9–10.3)
CO2: 26 mmol/L (ref 22–32)
CREATININE: 1.32 mg/dL — AB (ref 0.61–1.24)
Chloride: 97 mmol/L — ABNORMAL LOW (ref 98–111)
GFR calc Af Amer: >60 mL/min (ref >60)
GFR, EST NON AFRICAN AMERICAN: 55 mL/min — AB (ref >60)
GLUCOSE: 116 mg/dL — AB (ref 70–99)
Potassium: 3.9 mmol/L (ref 3.5–5.1)
Sodium: 132 mmol/L — ABNORMAL LOW (ref 135–145)

## 2018-08-07 LAB — MAGNESIUM: Magnesium: 2 mg/dL (ref 1.7–2.4)

## 2018-08-07 MED ORDER — BISACODYL 5 MG PO TBEC: 5.0000 mg | DELAYED_RELEASE_TABLET | Freq: Every day | ORAL | Status: DC | PRN

## 2018-08-07 MED ORDER — FUROSEMIDE 10 MG/ML IJ SOLN
60.0000 mg | Freq: Once | INTRAMUSCULAR | Status: AC
  Administered 2018-08-07: 60 mg via INTRAVENOUS
  Filled 2018-08-07: qty 6

## 2018-08-07 MED ORDER — FUROSEMIDE 10 MG/ML IJ SOLN
20.0000 mg | Freq: Two times a day (BID) | INTRAMUSCULAR | Status: DC
  Administered 2018-08-07 – 2018-08-08 (×2): 20 mg via INTRAVENOUS
  Filled 2018-08-07 (×2): qty 2

## 2018-08-07 NOTE — Plan of Care (Signed)

## 2018-08-07 NOTE — Progress Notes (Signed)
[facility] NAME: Robert Woodward    MR#:  [id number]  DATE OF BIRTH:  1951/08/19  SUBJECTIVE:  CHIEF COMPLAINT:   Chief Complaint  Patient presents with  . Shortness of Breath  . Leg Swelling    Generalized edema  . Ascites   The patient has nausea and vomiting with dark brown emesis this morning. REVIEW OF SYSTEMS:  Review of Systems  Constitutional: Negative for chills, fever and malaise/fatigue.  HENT: Negative for sore throat.   Eyes: Negative for blurred vision and double vision.  Respiratory: Negative for cough, hemoptysis, shortness of breath, wheezing and stridor.   Cardiovascular: Positive for leg swelling. Negative for chest pain, palpitations and orthopnea.  Gastrointestinal: Negative for abdominal pain, blood in stool, diarrhea, melena, nausea and vomiting.       Abdominal distention.  Genitourinary: Negative for dysuria, flank pain and hematuria.  Musculoskeletal: Negative for back pain and joint pain.  Neurological: Negative for dizziness, sensory change, focal weakness, seizures, loss of consciousness, weakness and headaches.  Endo/Heme/Allergies: Negative for polydipsia.  Psychiatric/Behavioral: Negative for depression. The patient is not nervous/anxious.     DRUG ALLERGIES:   Allergies  Allergen Reactions  . Benadryl [Diphenhydramine] Rash  . Wool Alcohol [Lanolin] Rash   VITALS:  Blood pressure 114/79, pulse (!) 103, temperature 98.2 F (36.8 C), temperature source Oral, resp. rate 20, height 5\' 11"  (1.803 m), weight 112.3 kg, SpO2 100 %. PHYSICAL EXAMINATION:  Physical Exam  Constitutional: He is oriented to person, place, and time. No distress.  HENT:  Head: Normocephalic.  Mouth/Throat: Oropharynx is clear and moist.  Eyes: Pupils are equal, round, and reactive to light. Conjunctivae and EOM are normal. No scleral icterus.  Neck: Normal range of motion. Neck supple. No JVD present. No  tracheal deviation present.  Cardiovascular: Normal rate, regular rhythm and normal heart sounds. Exam reveals no gallop.  No murmur heard. Pulmonary/Chest: Effort normal and breath sounds normal. No respiratory distress. He has no wheezes. He has no rales.  Abdominal: Soft. Bowel sounds are normal. He exhibits distension. There is no tenderness. There is no rebound and no guarding.  Genitourinary:  Genitourinary Comments: Scrotal edema.  Musculoskeletal: Normal range of motion. He exhibits edema. He exhibits no tenderness.  Bilateral leg edema 2-3+.  Neurological: He is alert and oriented to person, place, and time. No cranial nerve deficit.  Skin: No rash noted. No erythema.  Psychiatric: He has a normal mood and affect.   LABORATORY PANEL:  Male CBC Recent Labs  Lab 08/04/18 2027  WBC 12.2*  HGB 9.7*  HCT 29.3*  PLT 254   ------------------------------------------------------------------------------------------------------------------ Chemistries  Recent Labs  Lab 08/06/18 0354 08/07/18 0506  NA 134* 132*  K 3.5 3.9  CL 100 97*  CO2 27 26  GLUCOSE 125* 116*  BUN 29* 35*  CREATININE 1.37* 1.32*  CALCIUM 8.0* 8.1*  MG  --  2.0  AST 82*  --   ALT 38  --   ALKPHOS 71  --   BILITOT 2.1*  --    RADIOLOGY:  No results found. ASSESSMENT AND PLAN:   This is a 67 year old male admitted for sepsis.  1.   Sepsis on admission likely due to underlying liver mass, anasarca and cirrhosis.  Hydrated with intravenous fluid which was discontinued due to anasarca and ascites. Continue Rocephin to cover SBP.  2.  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  Evidence of portal  hypertension as well. Positive HCV antibody.  Ascites, possible malignant Status post paracentesis with 6 L drawl. Repeat paracentesis.  3.  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 suspicious for [facility], He currently has significant ascites and liver biopsy is therefore  challenging per Consult oncology.  4.  Anasarca: With associated ascites.  Continue Lasix and spironolactone. Hospice is a reasonable consideration at this time per Dr. Janese Banks.   5.  DVT prophylaxis: Lovenox is discontinued due to concerns of dark brown emesis.  Acute renal failure due to dehydration.  Possible due to diuretics.  Follow-up BMP. Lactic acidosis.  Improved.  Hyponatremia.  Follow-up BMP while on diuretics.  All the records are reviewed and case discussed with Care Management/Social Worker. Management plans discussed with the patient, family and they are in agreement.  CODE STATUS: DNR  TOTAL TIME TAKING CARE OF THIS PATIENT: 35 minutes.   More than 50% of the time was spent in counseling/coordination of care: YES  POSSIBLE D/C IN 2-3 DAYS, DEPENDING ON CLINICAL CONDITION.   Demetrios Loll M.D on 08/07/2018 at 1:53 PM  Between 7am to 6pm - Pager - [phone]  After 6pm go to [email or web address] - [facility]

## 2018-08-08 ENCOUNTER — Other Ambulatory Visit: Payer: Self-pay | Admitting: Oncology

## 2018-08-08 ENCOUNTER — Inpatient Hospital Stay: Payer: Medicare Other

## 2018-08-08 ENCOUNTER — Telehealth: Payer: Self-pay | Admitting: Oncology

## 2018-08-08 DIAGNOSIS — K7031 Alcoholic cirrhosis of liver with ascites: Secondary | ICD-10-CM

## 2018-08-08 LAB — BASIC METABOLIC PANEL
Anion gap: 9 (ref 5–15)
BUN: 35 mg/dL — ABNORMAL HIGH (ref 8–23)
CALCIUM: 7.9 mg/dL — AB (ref 8.9–10.3)
CO2: 27 mmol/L (ref 22–32)
Chloride: 96 mmol/L — ABNORMAL LOW (ref 98–111)
Creatinine, Ser: 1.31 mg/dL — ABNORMAL HIGH (ref 0.61–1.24)
GFR calc non Af Amer: 56 mL/min — ABNORMAL LOW (ref >60)
Glucose, Bld: 107 mg/dL — ABNORMAL HIGH (ref 70–99)
Potassium: 3.4 mmol/L — ABNORMAL LOW (ref 3.5–5.1)
SODIUM: 132 mmol/L — AB (ref 135–145)

## 2018-08-08 LAB — ECHOCARDIOGRAM COMPLETE
Height: 71 in
Weight: 4056.46 oz

## 2018-08-08 LAB — CYTOLOGY - NON PAP

## 2018-08-08 LAB — GRAM STAIN: Gram Stain: NONE SEEN

## 2018-08-08 LAB — HEPATITIS B SURFACE ANTIGEN: Hepatitis B Surface Ag: NEGATIVE

## 2018-08-08 MED ORDER — SODIUM CHLORIDE 0.9% FLUSH
3.0000 mL | Freq: Two times a day (BID) | INTRAVENOUS | Status: DC
  Administered 2018-08-08 – 2018-08-11 (×6): 3 mL via INTRAVENOUS

## 2018-08-08 MED ORDER — SODIUM CHLORIDE 0.9% FLUSH
3.0000 mL | INTRAVENOUS | Status: DC | PRN
  Administered 2018-08-08: 3 mL via INTRAVENOUS
  Filled 2018-08-08: qty 3

## 2018-08-08 MED ORDER — TRAMADOL HCL 50 MG PO TABS
50.0000 mg | ORAL_TABLET | Freq: Four times a day (QID) | ORAL | Status: DC | PRN
  Administered 2018-08-11: 50 mg via ORAL
  Filled 2018-08-08: qty 1

## 2018-08-08 MED ORDER — CYCLOBENZAPRINE HCL 10 MG PO TABS: 5.0000 mg | ORAL_TABLET | Freq: Three times a day (TID) | ORAL | Status: DC | PRN

## 2018-08-08 MED ORDER — FUROSEMIDE 10 MG/ML IJ SOLN
40.0000 mg | Freq: Two times a day (BID) | INTRAMUSCULAR | Status: DC
  Administered 2018-08-08 – 2018-08-10 (×3): 40 mg via INTRAVENOUS
  Filled 2018-08-08 (×3): qty 4

## 2018-08-08 MED ORDER — CIPROFLOXACIN HCL 500 MG PO TABS
500.0000 mg | ORAL_TABLET | Freq: Every day | ORAL | Status: DC
  Administered 2018-08-08 – 2018-08-11 (×4): 500 mg via ORAL
  Filled 2018-08-08 (×4): qty 1

## 2018-08-08 MED ORDER — MORPHINE SULFATE (PF) 4 MG/ML IV SOLN: 4.0000 mg | INTRAVENOUS | Status: DC | PRN

## 2018-08-08 NOTE — Telephone Encounter (Signed)
[facility] f/u on Thursday morning. No labs, per Dr. Jackelyn Knife chat msg.  **See Merrily Pew after MD**

## 2018-08-08 NOTE — Care Management Important Message (Signed)
Copy of signed IM left with patient in room.  

## 2018-08-08 NOTE — Plan of Care (Signed)
Another 6L removed today

## 2018-08-08 NOTE — Progress Notes (Signed)
[facility]  [phone] [phone]   Name: Robert Woodward Date: 08/08/2018 MRN: [id number]  DOB: Mar 06, 1951  Patient Care Team: Patient, No Pcp Per as PCP - General (General Practice)    REASON FOR CONSULTATION: Palliative Care consult requested for this 67 y.o. male for goals of medical treatment in patient with history of chronic alcohol abuse since age 27 (stopped heavy drinking 2 years ago), who was admitted on 08/05/2018 with abdominal pain, anasarca, and possible sepsis.  Work-up including abdominal CT revealed cirrhosis and multiple hepatic masses concerning for metastatic disease or multifocal hepatocellular carcinoma.  Patient was also noted to have ascites and is status post large-volume paracentesis with 6 L of ascitic fluid drained.  Patient has been seen in consultation by oncology.  Palliative care has also been requested to help establish medical goals.    CODE STATUS: DNR  PAST MEDICAL HISTORY:History reviewed. No pertinent past medical history.  PAST SURGICAL HISTORY:  Past Surgical History:  Procedure Laterality Date  . ANKLE RECONSTRUCTION      HEMATOLOGY/ONCOLOGY HISTORY:   No history exists.    ALLERGIES:  is allergic to benadryl [diphenhydramine] and wool alcohol [lanolin].  MEDICATIONS:  Current Facility-Administered Medications  Medication Dose Route Frequency Provider Last Rate Last Dose  . acetaminophen (TYLENOL) tablet 650 mg  650 mg Oral Q6H PRN Harrie Foreman, MD   650 mg at 08/08/18 1154   Or  . acetaminophen (TYLENOL) suppository 650 mg  650 mg Rectal Q6H PRN Harrie Foreman, MD      . bisacodyl (DULCOLAX) EC tablet 5 mg  5 mg Oral Daily PRN Demetrios Loll, MD      . calcium carbonate (TUMS - dosed in mg elemental calcium) chewable tablet 200 mg of elemental calcium  1 tablet Oral PRN Harrie Foreman, MD      . ciprofloxacin (CIPRO) tablet 500 mg  500 mg Oral Q breakfast  Demetrios Loll, MD   500 mg at 08/08/18 1240  . cyclobenzaprine (FLEXERIL) tablet 5 mg  5 mg Oral TID PRN Demetrios Loll, MD      . docusate sodium (COLACE) capsule 100 mg  100 mg Oral BID Harrie Foreman, MD   100 mg at 08/08/18 1002  . furosemide (LASIX) injection 40 mg  40 mg Intravenous Q12H Demetrios Loll, MD      . metoCLOPramide [facility]) injection 10 mg  10 mg Intravenous Q8H PRN Demetrios Loll, MD   10 mg at 08/06/18 1031  . morphine 4 MG/ML injection 4 mg  4 mg Intravenous Q4H PRN Demetrios Loll, MD      . ondansetron [facility]) tablet 4 mg  4 mg Oral Q6H PRN Harrie Foreman, MD       Or  . ondansetron [facility]) injection 4 mg  4 mg Intravenous Q6H PRN Harrie Foreman, MD   4 mg at 08/07/18 1545  . pantoprazole (PROTONIX) EC tablet 40 mg  40 mg Oral Daily Borders, Kirt Boys, NP   40 mg at 08/08/18 1002  . sodium chloride flush (NS) 0.9 % injection 3 mL  3 mL Intravenous Q12H Demetrios Loll, MD      . sodium chloride flush (NS) 0.9 % injection 3 mL  3 mL Intravenous PRN Demetrios Loll, MD      . spironolactone (ALDACTONE) tablet 100 mg  100 mg Oral Daily Harrie Foreman, MD   100 mg at 08/08/18 1002  . traMADol [facility]bon)  tablet 50 mg  50 mg Oral Q6H PRN Demetrios Loll, MD        VITAL SIGNS: BP 120/74   Pulse 91   Temp 98.4 F (36.9 C) (Oral)   Resp 20   Ht 5\' 11"  (1.803 m)   Wt 240 lb 4.8 oz (109 kg)   SpO2 100%   BMI 33.52 kg/m  Filed Weights   08/06/18 0400 08/07/18 0505 08/08/18 0343  Weight: 241 lb 4.8 oz (109.5 kg) 247 lb 9.6 oz (112.3 kg) 240 lb 4.8 oz (109 kg)    Estimated body mass index is 33.52 kg/m as calculated from the following:   Height as of this encounter: 5\' 11"  (1.803 m).   Weight as of this encounter: 240 lb 4.8 oz (109 kg).  LABS: CBC:    Component Value Date/Time   WBC 12.2 (H) 08/04/2018 2027   HGB 9.7 (L) 08/04/2018 2027   HCT 29.3 (L) 08/04/2018 2027   PLT 254 08/04/2018 2027   MCV 92.2 08/04/2018 2027   Comprehensive Metabolic Panel:    Component Value  Date/Time   NA 132 (L) 08/08/2018 0423   K 3.4 (L) 08/08/2018 0423   CL 96 (L) 08/08/2018 0423   CO2 27 08/08/2018 0423   BUN 35 (H) 08/08/2018 0423   CREATININE 1.31 (H) 08/08/2018 0423   GLUCOSE 107 (H) 08/08/2018 0423   CALCIUM 7.9 (L) 08/08/2018 0423   AST 82 (H) 08/06/2018 0354   ALT 38 08/06/2018 0354   ALKPHOS 71 08/06/2018 0354   BILITOT 2.1 (H) 08/06/2018 0354   PROT 5.5 (L) 08/06/2018 0354   ALBUMIN 2.6 (L) 08/06/2018 0354    RADIOGRAPHIC STUDIES: Dg Chest 2 View  Result Date: 08/04/2018 CLINICAL DATA:  Abdominal distension and diffuse body edema for the past 3 weeks. Dyspnea on exertion. EXAM: CHEST - 2 VIEW COMPARISON:  02/08/2018. FINDINGS: Normal sized heart. Clear lungs. Diffuse osteopenia. Mild thoracic spine degenerative changes. IMPRESSION: No acute abnormality. Electronically Signed   By: Claudie Revering M.D.   On: 08/04/2018 21:04   Ct Chest W Contrast  Result Date: 08/05/2018 CLINICAL DATA:  Liver cancer staging. Tightness and pain in the abdomen. EXAM: CT CHEST WITH CONTRAST TECHNIQUE: Multidetector CT imaging of the chest was performed during intravenous contrast administration. CONTRAST:  76mL OMNIPAQUE IOHEXOL 300 MG/ML  SOLN COMPARISON:  CT abdomen pelvis 08/04/2018. FINDINGS: Cardiovascular: Atherosclerotic calcification of the arterial vasculature, including three-vessel involvement of the coronary arteries. Heart size normal. No pericardial effusion. Mediastinum/Nodes: Triangular-shaped prevascular soft tissue lesion may represent thymus, measuring 1.5 cm in short axis. Additional mediastinal and hilar lymph nodes are subcentimeter in short axis size. No hilar or axillary adenopathy. There may be distal esophageal wall thickening. Lungs/Pleura: Minimal mucoid impaction in the left lower lobe. Lungs are otherwise clear. No pleural fluid. Adherent debris in the trachea. Upper Abdomen: Liver is shrunken and irregular and contains heterogeneous masses, better visualized  on yesterday's exam. Upper abdominal lymph nodes measure up to 1.2 cm in the portacaval station. Gallstones. Visualized portions of the adrenal glands, kidneys, spleen, pancreas, stomach and bowel are otherwise grossly unremarkable. Large ascites. Musculoskeletal: Degenerative changes in the spine. Patchy sclerosis in the lateral aspects of the seventh ribs bilaterally, which may be posttraumatic in etiology, given symmetry. Additional age indeterminate fractures are seen in the right posterolateral eighth and ninth ribs. IMPRESSION: 1. No evidence of primary malignancy or metastatic disease in the chest. 2. Possible hyperplastic thymic tissue in the prevascular space. Adenopathy is  not excluded. Continued attention on follow-up exams is warranted. 3. Cirrhosis with heterogeneous hepatic masses, better seen on 08/04/2018. 4. Large ascites. 5. Aortic atherosclerosis (ICD10-170.0). Three-vessel coronary artery calcification. 6. Cholelithiasis. Electronically Signed   By: Lorin Picket M.D.   On: 08/05/2018 12:48   Mr Abdomen W Wo Contrast  Addendum Date: 08/05/2018   ADDENDUM REPORT: 08/05/2018 14:52 ADDENDUM: The large enhancing lesions have early arterial enhancement and washout typical of hepatocellular carcinoma (LI-RADS 5 categorization). Electronically Signed   By: Suzy Bouchard M.D.   On: 08/05/2018 14:52   Result Date: 08/05/2018 CLINICAL DATA:  Hepatic masses. EXAM: MRI ABDOMEN WITHOUT AND WITH CONTRAST TECHNIQUE: Multiplanar multisequence MR imaging of the abdomen was performed both before and after the administration of intravenous contrast. CONTRAST:  7 mL Gadavist COMPARISON:  CT 08/04/2018 FINDINGS: Lower chest:  Lung bases are clear. Hepatobiliary: Multiple round masses in the liver are solid-appearing and demonstrate uniform arterial enhancement. Largest lesion the RIGHT hepatic lobe measures 8 cm (image 21/13). Smaller partially exophytic lesion from the posterolateral RIGHT hepatic lobe  measures 3.7 cm. There is lesion in the LEFT lateral hepatic lobe measuring 6.0 cm. Large lesion in the posterior RIGHT lobe liver measuring 7.7 cm. Underlying liver itself is shrunken. There is nodular contour to the liver. The portal veins are patent. There is a large volume of intraperitoneal free fluid suggesting ascites. No biliary duct dilatation. Pancreas: No pancreatic lesion.  No pancreatic ductal dilatation. Spleen: Spleen is normal volume. Adrenals/urinary tract: Adrenal glands and kidneys are normal. Stomach/Bowel: Stomach and limited of the small bowel is unremarkable Vascular/Lymphatic: Abdominal aortic normal caliber. No retroperitoneal periportal lymphadenopathy. Musculoskeletal: No aggressive osseous lesion. Schmorl's node at L1. IMPRESSION: 1. Large round enhancing lesions on the background cirrhotic morphology of the liver is most suggestive of multifocal hepatocellular carcinoma. 2. Large volume of intraperitoneal free fluid ascites associated with liver dysfunction. 3. Portal veins are patent.  Spleen normal volume. Electronically Signed: By: Suzy Bouchard M.D. On: 08/05/2018 13:11   Ct Abdomen Pelvis W Contrast  Result Date: 08/04/2018 CLINICAL DATA:  Abdominal distension with swelling EXAM: CT ABDOMEN AND PELVIS WITH CONTRAST TECHNIQUE: Multidetector CT imaging of the abdomen and pelvis was performed using the standard protocol following bolus administration of intravenous contrast. CONTRAST:  129mL ISOVUE-300 IOPAMIDOL (ISOVUE-300) INJECTION 61% COMPARISON:  None. FINDINGS: Lower chest: Lung bases demonstrate no acute consolidation or pleural effusion. The heart size is within normal limits. Small moderate hiatal hernia with fluid in the hernia sac. Hepatobiliary: Nodular liver contour consistent with cirrhosis. Multiple suspicious liver masses measuring up to 8.6 cm in size. Calcified gallstones. No biliary dilatation Pancreas: Unremarkable. No pancreatic ductal dilatation or  surrounding inflammatory changes. Spleen: Enlarged, coronal measurement of 15.5 cm. Vague hypodensity within the posterior spleen. Adrenals/Urinary Tract: Adrenal glands are within normal limits. No hydronephrosis. Bladder within normal limits. Stomach/Bowel: No bowel dilatation or bowel wall thickening. Centralization of bowel loops. Negative appendix. Vascular/Lymphatic: Moderate aortic atherosclerosis. No aneurysmal dilatation. Subcentimeter gastrohepatic lymph nodes. Reproductive: Prostate is unremarkable. Other: No free air. Massive ascites in the abdomen and pelvis. Generalized subcutaneous edema Musculoskeletal: Chronic appearing superior endplate deformity at L1 with Schmorl's node. No suspicious bone lesion. IMPRESSION: 1. 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 2. Massive quantity of ascites within the abdomen and pelvis 3. Splenomegaly. Vague hypodensity in the posterior spleen, not sure if this is due to heterogeneous enhancement or a vague hypodense mass in  the spleen. 4. Gallstones 5. Anasarca Electronically Signed   By: Donavan Foil M.D.   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 Maximum amount is 6 L. Prior paracentesis on 08/05/2018. EXAM: ULTRASOUND GUIDED PARACENTESIS MEDICATIONS: None. COMPLICATIONS: None immediate. PROCEDURE: Informed written consent was obtained from the patient after a discussion of the risks, benefits and alternatives to treatment. A timeout was performed prior to the initiation of the procedure. Initial ultrasound scanning demonstrates a large amount of ascites within the right lower abdominal quadrant. The right lower abdomen was prepped and draped in the usual sterile fashion. 1% lidocaine was used for local anesthesia. Following this, a 6 Fr Safe-T-Centesis catheter was introduced. An ultrasound  image was saved for documentation purposes. The paracentesis was performed. The catheter was removed and a dressing was applied. The patient tolerated the procedure well without immediate post procedural complication. FINDINGS: A total of approximately 6 L of bloody fluid was removed. Large amount of residual ascites after the 6 L were removed. IMPRESSION: Successful ultrasound-guided paracentesis yielding 6 liters of peritoneal fluid. Electronically Signed   By: Markus Daft M.D.   On: 08/08/2018 13:08   US Paracentesis  Result Date: 08/05/2018 INDICATION: Patient with abdominal distention, ascites. Request is made for diagnostic and therapeutic paracentesis. EXAM: ULTRASOUND GUIDED DIAGNOSTIC AND THERAPEUTIC PARACENTESIS MEDICATIONS: 10 mL 1% lidocaine COMPLICATIONS: None immediate. PROCEDURE: Informed written consent was obtained from the patient after a discussion of the risks, benefits and alternatives to treatment. A timeout was performed prior to the initiation of the procedure. Initial ultrasound scanning demonstrates a large amount of ascites within the right lower abdominal quadrant. The right lower abdomen was prepped and draped in the usual sterile fashion. 1% lidocaine was used for local anesthesia. Following this, a 6 Fr Safe-T-Centesis catheter was introduced. An ultrasound image was saved for documentation purposes. The paracentesis was performed. The catheter was removed and a dressing was applied. The patient tolerated the procedure well without immediate post procedural complication. FINDINGS: A total of approximately 6.0 liters of bloody fluid was removed. Samples were sent to the laboratory as requested by the clinical team. IMPRESSION: Successful ultrasound-guided diagnostic and therapeutic paracentesis yielding 6.0 liters of peritoneal fluid. Read by: Brynda Greathouse PA-C Electronically Signed   By: Lucrezia Europe M.D.   On: 08/05/2018 13:05    PERFORMANCE STATUS (ECOG) : 4 -  Bedbound  Review of Systems As noted above. Otherwise, a complete review of systems is negative.  Physical Exam General: frail appearing, NAD Lungs: Clear to auscultation bilaterally.  Heart: Regular rate and rhythm. Abdomen: Soft, nontender, less distended Musculoskeletal: trace edema Neuro: Alert, answering all questions appropriately. Cranial nerves grossly intact.  IMPRESSION: Patient is s/p repeat therapeutic paracentesis with an additional 6L of ascitic fluid drained. Feels better after the procedure. However, patient remains markedly deconditioned. PT evaluation reviewed. Patient required assistance to sit upright in bed.   Patient's sister is coming back to the hospital later this afternoon and I will return to clarify goals.   Case discussed with Dr. Janese Banks.  PLAN: 1. Family/pt meeting to clarify goals.    Patient expressed understanding and was in agreement with this plan.    Time Total: 20 minutes  Visit consisted of counseling and education dealing with the complex and emotionally intense issues of symptom management and palliative care in the setting of serious and potentially life-threatening illness.Greater than 50%  of this time was  spent counseling and coordinating care related to the above assessment and plan.  Signed by: Altha Harm, [place], NP-C, [place] (Work Cell)

## 2018-08-08 NOTE — Progress Notes (Signed)
I called and spoke with patient's sister by phone.  She had multiple questions about hospice services in the home.  We discussed hospice in detail.  We also talked about the option of follow-up in the clinic with consideration for work-up.  However, patient is likely a poor treatment candidate given his advanced cirrhosis.  Ultimately, sister says that she would want to forego work-up and treatment and instead focus on his comfort at home.  He would like to pursue hospice in the home.  We also talked about the future option of the hospice home in the event of decompensation instead of rehospitalization.  I then met again with patient to discuss my conversation with his sister.  I again reviewed the option of follow-up in the clinic versus home with hospice.  Patient is deferring decision-making to his sister.  However, he says he is in agreement with hospice care in the home.  We will ask the care manager to arrange hospice services.  I note that healthcare power of attorney documents have not yet been established.  We will reconsult the chaplain to help with this.  Time: 35 minutes

## 2018-08-08 NOTE — Progress Notes (Signed)
[facility] [place] NAME: Robert Woodward    MR#:  [id number]  DATE OF BIRTH:  22-Mar-1951  SUBJECTIVE:  CHIEF COMPLAINT:   Chief Complaint  Patient presents with  . Shortness of Breath  . Leg Swelling    Generalized edema  . Ascites   The patient has hand cramps and right foot pain.  Status post paracentesis today.  Better abdominal distention. REVIEW OF SYSTEMS:  Review of Systems  Constitutional: Negative for chills, fever and malaise/fatigue.  HENT: Negative for sore throat.   Eyes: Negative for blurred vision and double vision.  Respiratory: Negative for cough, hemoptysis, shortness of breath, wheezing and stridor.   Cardiovascular: Positive for leg swelling. Negative for chest pain, palpitations and orthopnea.  Gastrointestinal: Negative for abdominal pain, blood in stool, diarrhea, melena, nausea and vomiting.       Abdominal distention.  Genitourinary: Negative for dysuria, flank pain and hematuria.  Musculoskeletal: Negative for back pain and joint pain.  Neurological: Negative for dizziness, sensory change, focal weakness, seizures, loss of consciousness, weakness and headaches.  Endo/Heme/Allergies: Negative for polydipsia.  Psychiatric/Behavioral: Negative for depression. The patient is not nervous/anxious.     DRUG ALLERGIES:   Allergies  Allergen Reactions  . Benadryl [Diphenhydramine] Rash  . Wool Alcohol [Lanolin] Rash   VITALS:  Blood pressure 120/74, pulse 91, temperature 98.4 F (36.9 C), temperature source Oral, resp. rate 20, height 5\' 11"  (1.803 m), weight 109 kg, SpO2 100 %. PHYSICAL EXAMINATION:  Physical Exam  Constitutional: He is oriented to person, place, and time. No distress.  HENT:  Head: Normocephalic.  Mouth/Throat: Oropharynx is clear and moist.  Eyes: Pupils are equal, round, and reactive to light. Conjunctivae and EOM are normal. No scleral icterus.  Neck: Normal range of motion. Neck  supple. No JVD present. No tracheal deviation present.  Cardiovascular: Normal rate, regular rhythm and normal heart sounds. Exam reveals no gallop.  No murmur heard. Pulmonary/Chest: Effort normal and breath sounds normal. No respiratory distress. He has no wheezes. He has no rales.  Abdominal: Soft. Bowel sounds are normal. He exhibits distension. There is no tenderness. There is no rebound and no guarding.  Genitourinary:  Genitourinary Comments: Scrotal edema.  Musculoskeletal: Normal range of motion. He exhibits edema. He exhibits no tenderness.  Bilateral leg edema 2-3+.  Neurological: He is alert and oriented to person, place, and time. No cranial nerve deficit.  Skin: No rash noted. No erythema.  Psychiatric: He has a normal mood and affect.   LABORATORY PANEL:  Male CBC Recent Labs  Lab 08/04/18 2027  WBC 12.2*  HGB 9.7*  HCT 29.3*  PLT 254   ------------------------------------------------------------------------------------------------------------------ Chemistries  Recent Labs  Lab 08/06/18 0354 08/07/18 0506 08/08/18 0423  NA 134* 132* 132*  K 3.5 3.9 3.4*  CL 100 97* 96*  CO2 27 26 27   GLUCOSE 125* 116* 107*  BUN 29* 35* 35*  CREATININE 1.37* 1.32* 1.31*  CALCIUM 8.0* 8.1* 7.9*  MG  --  2.0  --   AST 82*  --   --   ALT 38  --   --   ALKPHOS 71  --   --   BILITOT 2.1*  --   --    RADIOLOGY:  US Paracentesis  Result Date: 08/08/2018 INDICATION: 67 year old with ascites due to alcoholic cirrhosis. Maximum amount is 6 L. Prior paracentesis on 08/05/2018. EXAM: ULTRASOUND GUIDED PARACENTESIS MEDICATIONS: None. COMPLICATIONS: None immediate. PROCEDURE: Informed written  consent was obtained from the patient after a discussion of the risks, benefits and alternatives to treatment. A timeout was performed prior to the initiation of the procedure. Initial ultrasound scanning demonstrates a large amount of ascites within the right lower abdominal quadrant. The right  lower abdomen was prepped and draped in the usual sterile fashion. 1% lidocaine was used for local anesthesia. Following this, a 6 Fr Safe-T-Centesis catheter was introduced. An ultrasound image was saved for documentation purposes. The paracentesis was performed. The catheter was removed and a dressing was applied. The patient tolerated the procedure well without immediate post procedural complication. FINDINGS: A total of approximately 6 L of bloody fluid was removed. Large amount of residual ascites after the 6 L were removed. IMPRESSION: Successful ultrasound-guided paracentesis yielding 6 liters of peritoneal fluid. Electronically Signed   By: Markus Daft M.D.   On: 08/08/2018 13:08   ASSESSMENT AND PLAN:   This is a 67 year old male admitted for sepsis.  1.   Sepsis on admission likely due to underlying liver mass, anasarca and cirrhosis.  Hydrated with intravenous fluid which was discontinued due to anasarca and ascites. He is on Rocephin to cover SBP.  No evidence of SBP.  Change to Cipro p.o. for prophylaxis.  2.  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  Evidence of portal hypertension as well. Positive HCV antibody.  Ascites, possible malignant Status post paracentesis with 6 L drawl. Repeated paracentesis with 6 L drawal.  3.  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 suspicious for [facility], He currently has significant ascites and liver biopsy is therefore challenging per Consult oncology.  4.  Anasarca: With associated ascites.  Continue Lasix and spironolactone. Hospice is a reasonable consideration at this time per Dr. Janese Banks.   5.  DVT prophylaxis: Lovenox is discontinued due to concerns of dark brown emesis.  Acute renal failure due to dehydration.  Possible due to diuretics.  Follow-up BMP. Lactic acidosis.  Improved.  Hyponatremia.  Follow-up BMP while on diuretics. Hypokalemia.  Given potassium supplement.  Generalized weakness. Very  poor prognosis, the patient needs hospice home. All the records are reviewed and case discussed with Care Management/Social Worker. Management plans discussed with the patient, family and they are in agreement.  CODE STATUS: DNR  TOTAL TIME TAKING CARE OF THIS PATIENT: 35 minutes.   More than 50% of the time was spent in counseling/coordination of care: YES  POSSIBLE D/C IN 2 DAYS, DEPENDING ON CLINICAL CONDITION.   Demetrios Loll M.D on 08/08/2018 at 1:48 PM  Between 7am to 6pm - Pager - [phone]  After 6pm go to [email or web address] - [facility]

## 2018-08-08 NOTE — Progress Notes (Signed)
Returned from Paracentesis feeling better.  Abd is soft and non tender.

## 2018-08-08 NOTE — Evaluation (Signed)
Physical Therapy Evaluation Patient Details Name: Robert Woodward MRN: [id number] DOB: 1951/01/24 Today's Date: 08/08/2018   History of Present Illness  Pt is a 67 yo male with no chronic medical problems presented to the emergency department complaining of abdominal pain and scrotal swelling.  The patient also admits to lower extremity edema and numbness in his feet.  He stated that he had been in so much pain that has been unable to get out of bed for 2 days.  In the emergency department patient was found to have a very tensely swollen abdomen.  CT of the abdomen revealed cirrhotic liver as well as multiple areas of hyperdensity concerning for metastases.  He was also found to have elevated troponin as well as significant lactic acidosis prompted the emergency department staff to the hospital service for admission.  Assessment includes: Sepsis, cirrhosis, ascites, liver masses, anasarca, and ARF due to dehydration.      Clinical Impression  Pt presents with deficits in strength, transfers, mobility, gait, balance, and activity tolerance.  Pt was pleasant and cooperative and attempted all tasks during the session but ultimately was very weak functionally and limited by lower body swelling.  Pt required near total assist with sup to/from sit and once seated at the EOB was unable to clear the surface of the mattress during sit to stand attempts.  Pt's functionally mobility has declined significantly from his stated baseline and pt would be unsafe to return to his prior living situation at this time.  Pt will benefit from PT services in a SNF setting upon discharge to safely address above deficits for decreased caregiver assistance and eventual return to PLOF.      Follow Up Recommendations SNF    Equipment Recommendations  Other (comment)(TBD at next venue of care)    Recommendations for Other Services       Precautions / Restrictions Precautions Precautions: Fall Restrictions Weight Bearing  Restrictions: No      Mobility  Bed Mobility Overal bed mobility: Needs Assistance Bed Mobility: Supine to Sit;Sit to Supine     Supine to sit: Max assist;+2 for physical assistance Sit to supine: Max assist;+2 for physical assistance   General bed mobility comments: Pt with good effort with bed mobility tasks but ultimately was able to provide very little assistance  Transfers                 General transfer comment: Multiple attempts to stand from elevated EOB with pt unable to clear the surface of the bed  Ambulation/Gait             General Gait Details: Unable/unsafe to attempt  Stairs            Wheelchair Mobility    Modified Rankin (Stroke Patients Only)       Balance Overall balance assessment: Needs assistance Sitting-balance support: Bilateral upper extremity supported;Feet supported Sitting balance-Leahy Scale: Good         Standing balance comment: NT, pt unable to stand                             Pertinent Vitals/Pain Pain Assessment: 0-10 Pain Score: 7  Pain Location: R ankle Pain Descriptors / Indicators: Sore Pain Intervention(s): Monitored during session;Other (comment)(Pt stated has declined pain medication)    Home Living Family/patient expects to be discharged to:: Private residence Living Arrangements: Other relatives(Sister) Available Help at Discharge: Family;Available 24 hours/day Type of Home: House  Home Access: Stairs to enter Entrance Stairs-Rails: Right;Left;Can reach both Entrance Stairs-Number of Steps: 2 Home Layout: One level Home Equipment: Cane - single point      Prior Function Level of Independence: Independent with assistive device(s)         Comments: Mod Ind with amb with a SPC in the community and without an AD in the home, Ind with ADLs, one fall in the last year of unkown cause     Hand Dominance        Extremity/Trunk Assessment   Upper Extremity Assessment Upper  Extremity Assessment: Generalized weakness    Lower Extremity Assessment Lower Extremity Assessment: Generalized weakness       Communication   Communication: No difficulties  Cognition Arousal/Alertness: Awake/alert Behavior During Therapy: WFL for tasks assessed/performed Overall Cognitive Status: Within Functional Limits for tasks assessed                                        General Comments      Exercises Total Joint Exercises Ankle Circles/Pumps: AROM;Both;10 reps Quad Sets: Strengthening;Both;10 reps Gluteal Sets: Strengthening;Both;10 reps Heel Slides: AAROM;Both;5 reps Hip ABduction/ADduction: AAROM;Both;5 reps Long Arc Quad: AROM;Both;5 reps Knee Flexion: AROM;Both;5 reps Other Exercises Other Exercises: HEP education and review for BLE APs, QS, GS, and LAQs x 10 each 5-6x/day   Assessment/Plan    PT Assessment Patient needs continued PT services  PT Problem List Decreased strength;Decreased activity tolerance;Decreased balance;Decreased mobility       PT Treatment Interventions Stair training;Functional mobility training;Balance training;DME instruction;Therapeutic activities;Gait training;Therapeutic exercise;Patient/family education    PT Goals (Current goals can be found in the Care Plan section)  Acute Rehab PT Goals Patient Stated Goal: To walk better PT Goal Formulation: With patient Time For Goal Achievement: 08-31-2018 Potential to Achieve Goals: Good    Frequency Min 2X/week   Barriers to discharge Inaccessible home environment      Co-evaluation               AM-PAC PT "6 Clicks" Daily Activity  Outcome Measure Difficulty turning over in bed (including adjusting bedclothes, sheets and blankets)?: Unable Difficulty moving from lying on back to sitting on the side of the bed? : Unable Difficulty sitting down on and standing up from a chair with arms (e.g., wheelchair, bedside commode, etc,.)?: Unable Help needed  moving to and from a bed to chair (including a wheelchair)?: A Lot Help needed walking in hospital room?: Total Help needed climbing 3-5 steps with a railing? : Total 6 Click Score: 7    End of Session Equipment Utilized During Treatment: Gait belt Activity Tolerance: Patient tolerated treatment well Patient left: in bed;with call bell/phone within reach;with bed alarm set Nurse Communication: Mobility status PT Visit Diagnosis: Muscle weakness (generalized) (M62.81);Difficulty in walking, not elsewhere classified (R26.2)    Time: 5397-6734 PT Time Calculation (min) (ACUTE ONLY): 32 min   Charges:   PT Evaluation $PT Eval Low Complexity: 1 Low PT Treatments $Therapeutic Exercise: 8-22 mins        D. Scott Jamaris Theard PT, DPT 08/08/18, 1:43 PM

## 2018-08-08 NOTE — Progress Notes (Signed)
Advanced Directive Education offered with Robert Woodward and his sister (as he requested).

## 2018-08-09 LAB — BASIC METABOLIC PANEL
ANION GAP: 5 (ref 5–15)
BUN: 36 mg/dL — ABNORMAL HIGH (ref 8–23)
CALCIUM: 7.7 mg/dL — AB (ref 8.9–10.3)
CO2: 30 mmol/L (ref 22–32)
Chloride: 94 mmol/L — ABNORMAL LOW (ref 98–111)
Creatinine, Ser: 1.34 mg/dL — ABNORMAL HIGH (ref 0.61–1.24)
GFR, EST NON AFRICAN AMERICAN: 54 mL/min — AB (ref >60)
Glucose, Bld: 104 mg/dL — ABNORMAL HIGH (ref 70–99)
POTASSIUM: 3.3 mmol/L — AB (ref 3.5–5.1)
SODIUM: 129 mmol/L — AB (ref 135–145)

## 2018-08-09 LAB — MAGNESIUM: Magnesium: 1.8 mg/dL (ref 1.7–2.4)

## 2018-08-09 MED ORDER — CHLORPROMAZINE HCL 25 MG PO TABS
25.0000 mg | ORAL_TABLET | Freq: Three times a day (TID) | ORAL | Status: DC | PRN
  Administered 2018-08-09: 25 mg via ORAL
  Filled 2018-08-09 (×2): qty 1

## 2018-08-09 MED ORDER — SODIUM CHLORIDE 0.9 % IV SOLN
12.5000 mg | Freq: Four times a day (QID) | INTRAVENOUS | Status: DC | PRN
  Filled 2018-08-09: qty 0.5

## 2018-08-09 MED ORDER — POTASSIUM CHLORIDE CRYS ER 20 MEQ PO TBCR
40.0000 meq | EXTENDED_RELEASE_TABLET | Freq: Every day | ORAL | Status: DC
  Administered 2018-08-09 – 2018-08-11 (×3): 40 meq via ORAL
  Filled 2018-08-09 (×3): qty 2

## 2018-08-09 NOTE — Progress Notes (Signed)
[place] [place] NAME: Robert Woodward    MR#:  [id number]  DATE OF BIRTH:  Aug 24, 1951  SUBJECTIVE:  CHIEF COMPLAINT:   Chief Complaint  Patient presents with  . Shortness of Breath  . Leg Swelling    Generalized edema  . Ascites   The patient still has abdominal distention, scrotal and leg swelling.  Status post paracentesis today.  Better abdominal distention. REVIEW OF SYSTEMS:  Review of Systems  Constitutional: Negative for chills, fever and malaise/fatigue.  HENT: Negative for sore throat.   Eyes: Negative for blurred vision and double vision.  Respiratory: Negative for cough, hemoptysis, shortness of breath, wheezing and stridor.   Cardiovascular: Positive for leg swelling. Negative for chest pain, palpitations and orthopnea.  Gastrointestinal: Negative for abdominal pain, blood in stool, diarrhea, melena, nausea and vomiting.       Abdominal distention.  Genitourinary: Negative for dysuria, flank pain and hematuria.  Musculoskeletal: Negative for back pain and joint pain.  Neurological: Negative for dizziness, sensory change, focal weakness, seizures, loss of consciousness, weakness and headaches.  Endo/Heme/Allergies: Negative for polydipsia.  Psychiatric/Behavioral: Negative for depression. The patient is not nervous/anxious.     DRUG ALLERGIES:   Allergies  Allergen Reactions  . Benadryl [Diphenhydramine] Rash  . Wool Alcohol [Lanolin] Rash   VITALS:  Blood pressure 97/81, pulse 92, temperature 98.2 F (36.8 C), temperature source Oral, resp. rate 19, height 5\' 11"  (1.803 m), weight 110.6 kg, SpO2 100 %. PHYSICAL EXAMINATION:  Physical Exam  Constitutional: He is oriented to person, place, and time. No distress.  HENT:  Head: Normocephalic.  Mouth/Throat: Oropharynx is clear and moist.  Eyes: Pupils are equal, round, and reactive to light. Conjunctivae and EOM are normal. No scleral icterus.  Neck: Normal  range of motion. Neck supple. No JVD present. No tracheal deviation present.  Cardiovascular: Normal rate, regular rhythm and normal heart sounds. Exam reveals no gallop.  No murmur heard. Pulmonary/Chest: Effort normal and breath sounds normal. No respiratory distress. He has no wheezes. He has no rales.  Abdominal: Soft. Bowel sounds are normal. He exhibits distension. There is no tenderness. There is no rebound and no guarding.  Genitourinary:  Genitourinary Comments: Scrotal edema.  Musculoskeletal: Normal range of motion. He exhibits edema. He exhibits no tenderness.  Bilateral leg edema 2-3+.  Neurological: He is alert and oriented to person, place, and time. No cranial nerve deficit.  Skin: No rash noted. No erythema.  Psychiatric: He has a normal mood and affect.   LABORATORY PANEL:  Male CBC Recent Labs  Lab 08/04/18 2027  WBC 12.2*  HGB 9.7*  HCT 29.3*  PLT 254   ------------------------------------------------------------------------------------------------------------------ Chemistries  Recent Labs  Lab 08/06/18 0354  08/09/18 0853  NA 134*   < > 129*  K 3.5   < > 3.3*  CL 100   < > 94*  CO2 27   < > 30  GLUCOSE 125*   < > 104*  BUN 29*   < > 36*  CREATININE 1.37*   < > 1.34*  CALCIUM 8.0*   < > 7.7*  MG  --    < > 1.8  AST 82*  --   --   ALT 38  --   --   ALKPHOS 71  --   --   BILITOT 2.1*  --   --    < > = values in this interval not displayed.   RADIOLOGY:  US Paracentesis  Result Date: 08/08/2018 INDICATION: 67 year old with ascites due to alcoholic cirrhosis. Maximum amount is 6 L. Prior paracentesis on 08/05/2018. EXAM: ULTRASOUND GUIDED PARACENTESIS MEDICATIONS: None. COMPLICATIONS: None immediate. PROCEDURE: Informed written consent was obtained from the patient after a discussion of the risks, benefits and alternatives to treatment. A timeout was performed prior to the initiation of the procedure. Initial ultrasound scanning demonstrates a large  amount of ascites within the right lower abdominal quadrant. The right lower abdomen was prepped and draped in the usual sterile fashion. 1% lidocaine was used for local anesthesia. Following this, a 6 Fr Safe-T-Centesis catheter was introduced. An ultrasound image was saved for documentation purposes. The paracentesis was performed. The catheter was removed and a dressing was applied. The patient tolerated the procedure well without immediate post procedural complication. FINDINGS: A total of approximately 6 L of bloody fluid was removed. Large amount of residual ascites after the 6 L were removed. IMPRESSION: Successful ultrasound-guided paracentesis yielding 6 liters of peritoneal fluid. Electronically Signed   By: Markus Daft M.D.   On: 08/08/2018 13:08   ASSESSMENT AND PLAN:   This is a 67 year old male admitted for sepsis.  1.   Sepsis on admission likely due to underlying liver mass, anasarca and cirrhosis.  Hydrated with intravenous fluid which was discontinued due to anasarca and ascites. He is on Rocephin to cover SBP.  No evidence of SBP.  Changed to Cipro p.o. for prophylaxis.  2.  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  Evidence of portal hypertension as well. Positive HCV antibody.  Ascites, possible malignant Status post paracentesis with 6 L drawl. Repeated paracentesis with 6 L drawal. The patient needs to be drainage for recurrent ascites.  Surgical consult.  3.  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 suspicious for [facility], He currently has significant ascites and liver biopsy is therefore challenging per Consult oncology.  4.  Anasarca: With associated ascites.  Continue Lasix and spironolactone. Hospice is a reasonable consideration at this time per Dr. Janese Banks.   5.  DVT prophylaxis: Lovenox is discontinued due to concerns of dark brown emesis.  Acute renal failure due to dehydration.  Possible due to diuretics.  Follow-up BMP. Lactic  acidosis.  Improved.  Hyponatremia.  Worsening, follow-up BMP while on diuretics. Hypokalemia.  Continue potassium supplement.  Follow-up BMP.  Generalized weakness.  PT. Very poor prognosis, the patient needs hospice care. I discussed with Josh, palliative care staff. All the records are reviewed and case discussed with Care Management/Social Worker. Management plans discussed with the patient, family and they are in agreement.  CODE STATUS: DNR  TOTAL TIME TAKING CARE OF THIS PATIENT: 35 minutes.   More than 50% of the time was spent in counseling/coordination of care: YES  POSSIBLE D/C IN 2 DAYS, DEPENDING ON CLINICAL CONDITION.   Demetrios Loll M.D on 08/09/2018 at 10:37 AM  Between 7am to 6pm - Pager - [phone]  After 6pm go to [email or web address] - [employer] [facility]

## 2018-08-09 NOTE — Care Management Note (Signed)
Case Management Note  Patient Details  Name: Clerence Gubser MRN: [id number] Date of Birth: 1951-09-11  Subjective/Objective:    Spoke with patients sister after patient insisted I ask her who would be best for hospice care.  Left message with Nicholaus Corolla sister [phone] earlier today.  She called RNCM back and she states she and her brother have  decided on [facility].  Notified Santiago Glad, RN with hospice of their decision.                 Action/Plan:   Expected Discharge Date:                  Expected Discharge Plan:  Home w Hospice Care  In-House Referral:     Discharge planning Services  CM Consult  Post Acute Care Choice:    Choice offered to:  Patient, Sibling  DME Arranged:    DME Agency:     HH Arranged:    [place] Agency:  [facility]  Status of Service:  In process, will continue to follow  If discussed at Long Length of Stay Meetings, dates discussed:    Additional Comments:  Elza Rafter, RN 08/09/2018, 4:48 PM

## 2018-08-09 NOTE — Care Management Note (Signed)
Case Management Note  Patient Details  Name: Robert Woodward MRN: [id number] Date of Birth: [birth year]  Subjective/Objective:     When patient was initially admitted there was no insurance listed and patient stated he did not have insurance.  Since his admission the insurance has been updated and he does have medicare part A and B.    Action/Plan:Sister is supposed to visit this morning.  Will discuss preference of hospice choices.  List was given to patient yesterday and he wanted to speak with his sister about it last night.                  Expected Discharge Date:                  Expected Discharge Plan:  Home w Hospice Care  In-House Referral:     Discharge planning Services  CM Consult  Post Acute Care Choice:    Choice offered to:     DME Arranged:    DME Agency:     HH Arranged:    [place] Agency:     Status of Service:  In process, will continue to follow  If discussed at Long Length of Stay Meetings, dates discussed:    Additional Comments:  Elza Rafter, RN 08/09/2018, 9:21 AM

## 2018-08-09 NOTE — Progress Notes (Signed)
New referral for [facility] services at home received from [facility]. Patient information faxed to referral. Writer to follow up with patient and his sister in the morning. Jennifer aware. Flo Shanks RN, BSN, [place] [facility], hospital Liaison [phone]

## 2018-08-09 NOTE — Progress Notes (Signed)
PT Cancellation Note  Patient Details Name: Arlis Yale MRN: [id number] DOB: [birth year]   Cancelled Treatment:    Reason Eval/Treat Not Completed: Other (comment). Treatment attempted twice; pt in pt care. Re attempt at a later date as the schedule allows.    Larae Grooms, PTA 08/09/2018, 3:47 PM

## 2018-08-09 NOTE — Progress Notes (Signed)
Spoke with interventional radiologist Dr. [phone], this is regarding Tenckhoff catheter for ascites management, interventional radiologist mentioned that most likely he can get Tenckhoff catheter tomorrow or Thursday.

## 2018-08-09 NOTE — Progress Notes (Signed)
[place]  [phone] [phone]) [phone]   Name: Robert Woodward Date: 08/09/2018 MRN: [id number]  DOB: 1951-08-30  Patient Care Team: Patient, No Pcp Per as PCP - General (General Practice)    REASON FOR CONSULTATION: Palliative Care consult requested for this 67 y.o. male for goals of medical treatment in patient with history of chronic alcohol abuse since age 31 (stopped heavy drinking 2 years ago), who was admitted on 08/05/2018 with abdominal pain, anasarca, and possible sepsis.  Work-up including abdominal CT revealed cirrhosis and multiple hepatic masses concerning for metastatic disease or multifocal hepatocellular carcinoma.  Patient was also noted to have ascites and is status post large-volume paracentesis with 6 L of ascitic fluid drained.  Patient has been seen in consultation by oncology.  Palliative care has also been requested to help establish medical goals.    CODE STATUS: DNR  PAST MEDICAL HISTORY:History reviewed. No pertinent past medical history.  PAST SURGICAL HISTORY:  Past Surgical History:  Procedure Laterality Date  . ANKLE RECONSTRUCTION      HEMATOLOGY/ONCOLOGY HISTORY:   No history exists.    ALLERGIES:  is allergic to benadryl [diphenhydramine] and wool alcohol [lanolin].  MEDICATIONS:  Current Facility-Administered Medications  Medication Dose Route Frequency Provider Last Rate Last Dose  . acetaminophen (TYLENOL) tablet 650 mg  650 mg Oral Q6H PRN Harrie Foreman, MD   650 mg at 08/08/18 1154   Or  . acetaminophen (TYLENOL) suppository 650 mg  650 mg Rectal Q6H PRN Harrie Foreman, MD      . bisacodyl (DULCOLAX) EC tablet 5 mg  5 mg Oral Daily PRN Demetrios Loll, MD      . calcium carbonate (TUMS - dosed in mg elemental calcium) chewable tablet 200 mg of elemental calcium  1 tablet Oral PRN Harrie Foreman, MD      . ciprofloxacin (CIPRO) tablet 500 mg  500 mg Oral Q breakfast  Demetrios Loll, MD   500 mg at 08/09/18 0848  . cyclobenzaprine (FLEXERIL) tablet 5 mg  5 mg Oral TID PRN Demetrios Loll, MD      . docusate sodium (COLACE) capsule 100 mg  100 mg Oral BID Harrie Foreman, MD   100 mg at 08/09/18 0847  . furosemide (LASIX) injection 40 mg  40 mg Intravenous Annitta Needs, MD   40 mg at 08/09/18 0616  . metoCLOPramide (REGLAN) injection 10 mg  10 mg Intravenous Q8H PRN Demetrios Loll, MD   10 mg at 08/06/18 1031  . morphine 4 MG/ML injection 4 mg  4 mg Intravenous Q4H PRN Demetrios Loll, MD      . ondansetron [facility]) tablet 4 mg  4 mg Oral Q6H PRN Harrie Foreman, MD       Or  . ondansetron [facility]) injection 4 mg  4 mg Intravenous Q6H PRN Harrie Foreman, MD   4 mg at 08/07/18 1545  . pantoprazole (PROTONIX) EC tablet 40 mg  40 mg Oral Daily Borders, Kirt Boys, NP   40 mg at 08/09/18 0847  . potassium chloride SA (K-DUR,KLOR-CON) CR tablet 40 mEq  40 mEq Oral Daily Demetrios Loll, MD      . sodium chloride flush (NS) 0.9 % injection 3 mL  3 mL Intravenous Q12H Demetrios Loll, MD   3 mL at 08/09/18 0850  . sodium chloride flush (NS) 0.9 % injection 3 mL  3 mL Intravenous PRN Demetrios Loll, MD   3  mL at 08/08/18 1819  . spironolactone (ALDACTONE) tablet 100 mg  100 mg Oral Daily Harrie Foreman, MD   100 mg at 08/09/18 0848  . traMADol (ULTRAM) tablet 50 mg  50 mg Oral Q6H PRN Demetrios Loll, MD        VITAL SIGNS: BP 97/81 (BP Location: Right Arm)   Pulse 92   Temp 98.2 F (36.8 C) (Oral)   Resp 19   Ht 5\' 11"  (1.803 m)   Wt 243 lb 13.3 oz (110.6 kg)   SpO2 100%   BMI 34.01 kg/m  Filed Weights   08/07/18 0505 08/08/18 0343 08/09/18 0444  Weight: 247 lb 9.6 oz (112.3 kg) 240 lb 4.8 oz (109 kg) 243 lb 13.3 oz (110.6 kg)    Estimated body mass index is 34.01 kg/m as calculated from the following:   Height as of this encounter: 5\' 11"  (1.803 m).   Weight as of this encounter: 243 lb 13.3 oz (110.6 kg).  LABS: CBC:    Component Value Date/Time   WBC 12.2 (H)  08/04/2018 2027   HGB 9.7 (L) 08/04/2018 2027   HCT 29.3 (L) 08/04/2018 2027   PLT 254 08/04/2018 2027   MCV 92.2 08/04/2018 2027   Comprehensive Metabolic Panel:    Component Value Date/Time   NA 129 (L) 08/09/2018 0853   K 3.3 (L) 08/09/2018 0853   CL 94 (L) 08/09/2018 0853   CO2 30 08/09/2018 0853   BUN 36 (H) 08/09/2018 0853   CREATININE 1.34 (H) 08/09/2018 0853   GLUCOSE 104 (H) 08/09/2018 0853   CALCIUM 7.7 (L) 08/09/2018 0853   AST 82 (H) 08/06/2018 0354   ALT 38 08/06/2018 0354   ALKPHOS 71 08/06/2018 0354   BILITOT 2.1 (H) 08/06/2018 0354   PROT 5.5 (L) 08/06/2018 0354   ALBUMIN 2.6 (L) 08/06/2018 0354    RADIOGRAPHIC STUDIES: Dg Chest 2 View  Result Date: 08/04/2018 CLINICAL DATA:  Abdominal distension and diffuse body edema for the past 3 weeks. Dyspnea on exertion. EXAM: CHEST - 2 VIEW COMPARISON:  02/08/2018. FINDINGS: Normal sized heart. Clear lungs. Diffuse osteopenia. Mild thoracic spine degenerative changes. IMPRESSION: No acute abnormality. Electronically Signed   By: Claudie Revering M.D.   On: 08/04/2018 21:04   Ct Chest W Contrast  Result Date: 08/05/2018 CLINICAL DATA:  Liver cancer staging. Tightness and pain in the abdomen. EXAM: CT CHEST WITH CONTRAST TECHNIQUE: Multidetector CT imaging of the chest was performed during intravenous contrast administration. CONTRAST:  73mL OMNIPAQUE IOHEXOL 300 MG/ML  SOLN COMPARISON:  CT abdomen pelvis 08/04/2018. FINDINGS: Cardiovascular: Atherosclerotic calcification of the arterial vasculature, including three-vessel involvement of the coronary arteries. Heart size normal. No pericardial effusion. Mediastinum/Nodes: Triangular-shaped prevascular soft tissue lesion may represent thymus, measuring 1.5 cm in short axis. Additional mediastinal and hilar lymph nodes are subcentimeter in short axis size. No hilar or axillary adenopathy. There may be distal esophageal wall thickening. Lungs/Pleura: Minimal mucoid impaction in the left  lower lobe. Lungs are otherwise clear. No pleural fluid. Adherent debris in the trachea. Upper Abdomen: Liver is shrunken and irregular and contains heterogeneous masses, better visualized on yesterday's exam. Upper abdominal lymph nodes measure up to 1.2 cm in the portacaval station. Gallstones. Visualized portions of the adrenal glands, kidneys, spleen, pancreas, stomach and bowel are otherwise grossly unremarkable. Large ascites. Musculoskeletal: Degenerative changes in the spine. Patchy sclerosis in the lateral aspects of the seventh ribs bilaterally, which may be posttraumatic in etiology, given symmetry. Additional age indeterminate  fractures are seen in the right posterolateral eighth and ninth ribs. IMPRESSION: 1. No evidence of primary malignancy or metastatic disease in the chest. 2. Possible hyperplastic thymic tissue in the prevascular space. Adenopathy is not excluded. Continued attention on follow-up exams is warranted. 3. Cirrhosis with heterogeneous hepatic masses, better seen on 08/04/2018. 4. Large ascites. 5. Aortic atherosclerosis (ICD10-170.0). Three-vessel coronary artery calcification. 6. Cholelithiasis. Electronically Signed   By: Lorin Picket M.D.   On: 08/05/2018 12:48   Mr Abdomen W Wo Contrast  Addendum Date: 08/05/2018   ADDENDUM REPORT: 08/05/2018 14:52 ADDENDUM: The large enhancing lesions have early arterial enhancement and washout typical of hepatocellular carcinoma (LI-RADS 5 categorization). Electronically Signed   By: Suzy Bouchard M.D.   On: 08/05/2018 14:52   Result Date: 08/05/2018 CLINICAL DATA:  Hepatic masses. EXAM: MRI ABDOMEN WITHOUT AND WITH CONTRAST TECHNIQUE: Multiplanar multisequence MR imaging of the abdomen was performed both before and after the administration of intravenous contrast. CONTRAST:  7 mL Gadavist COMPARISON:  CT 08/04/2018 FINDINGS: Lower chest:  Lung bases are clear. Hepatobiliary: Multiple round masses in the liver are solid-appearing and  demonstrate uniform arterial enhancement. Largest lesion the RIGHT hepatic lobe measures 8 cm (image 21/13). Smaller partially exophytic lesion from the posterolateral RIGHT hepatic lobe measures 3.7 cm. There is lesion in the LEFT lateral hepatic lobe measuring 6.0 cm. Large lesion in the posterior RIGHT lobe liver measuring 7.7 cm. Underlying liver itself is shrunken. There is nodular contour to the liver. The portal veins are patent. There is a large volume of intraperitoneal free fluid suggesting ascites. No biliary duct dilatation. Pancreas: No pancreatic lesion.  No pancreatic ductal dilatation. Spleen: Spleen is normal volume. Adrenals/urinary tract: Adrenal glands and kidneys are normal. Stomach/Bowel: Stomach and limited of the small bowel is unremarkable Vascular/Lymphatic: Abdominal aortic normal caliber. No retroperitoneal periportal lymphadenopathy. Musculoskeletal: No aggressive osseous lesion. Schmorl's node at L1. IMPRESSION: 1. Large round enhancing lesions on the background cirrhotic morphology of the liver is most suggestive of multifocal hepatocellular carcinoma. 2. Large volume of intraperitoneal free fluid ascites associated with liver dysfunction. 3. Portal veins are patent.  Spleen normal volume. Electronically Signed: By: Suzy Bouchard M.D. On: 08/05/2018 13:11   Ct Abdomen Pelvis W Contrast  Result Date: 08/04/2018 CLINICAL DATA:  Abdominal distension with swelling EXAM: CT ABDOMEN AND PELVIS WITH CONTRAST TECHNIQUE: Multidetector CT imaging of the abdomen and pelvis was performed using the standard protocol following bolus administration of intravenous contrast. CONTRAST:  126mL ISOVUE-300 IOPAMIDOL (ISOVUE-300) INJECTION 61% COMPARISON:  None. FINDINGS: Lower chest: Lung bases demonstrate no acute consolidation or pleural effusion. The heart size is within normal limits. Small moderate hiatal hernia with fluid in the hernia sac. Hepatobiliary: Nodular liver contour consistent with  cirrhosis. Multiple suspicious liver masses measuring up to 8.6 cm in size. Calcified gallstones. No biliary dilatation Pancreas: Unremarkable. No pancreatic ductal dilatation or surrounding inflammatory changes. Spleen: Enlarged, coronal measurement of 15.5 cm. Vague hypodensity within the posterior spleen. Adrenals/Urinary Tract: Adrenal glands are within normal limits. No hydronephrosis. Bladder within normal limits. Stomach/Bowel: No bowel dilatation or bowel wall thickening. Centralization of bowel loops. Negative appendix. Vascular/Lymphatic: Moderate aortic atherosclerosis. No aneurysmal dilatation. Subcentimeter gastrohepatic lymph nodes. Reproductive: Prostate is unremarkable. Other: No free air. Massive ascites in the abdomen and pelvis. Generalized subcutaneous edema Musculoskeletal: Chronic appearing superior endplate deformity at L1 with Schmorl's node. No suspicious bone lesion. IMPRESSION: 1. Cirrhotic morphology of the liver with multiple hepatic masses, either representing metastatic disease or  multifocal HCC. 2. Massive quantity of ascites within the abdomen and pelvis 3. Splenomegaly. Vague hypodensity in the posterior spleen, not sure if this is due to heterogeneous enhancement or a vague hypodense mass in the spleen. 4. Gallstones 5. Anasarca Electronically Signed   By: Donavan Foil M.D.   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 Maximum amount is 6 L. Prior paracentesis on 08/05/2018. EXAM: ULTRASOUND GUIDED PARACENTESIS MEDICATIONS: None. COMPLICATIONS: None immediate. PROCEDURE: Informed written consent was obtained from the patient after a discussion of the risks, benefits and alternatives to treatment. A timeout was performed prior to the initiation of the procedure. Initial ultrasound scanning demonstrates a large amount of ascites within the right lower abdominal quadrant. The right lower abdomen  was prepped and draped in the usual sterile fashion. 1% lidocaine was used for local anesthesia. Following this, a 6 Fr Safe-T-Centesis catheter was introduced. An ultrasound image was saved for documentation purposes. The paracentesis was performed. The catheter was removed and a dressing was applied. The patient tolerated the procedure well without immediate post procedural complication. FINDINGS: A total of approximately 6 L of bloody fluid was removed. Large amount of residual ascites after the 6 L were removed. IMPRESSION: Successful ultrasound-guided paracentesis yielding 6 liters of peritoneal fluid. Electronically Signed   By: Markus Daft M.D.   On: 08/08/2018 13:08   US Paracentesis  Result Date: 08/05/2018 INDICATION: Patient with abdominal distention, ascites. Request is made for diagnostic and therapeutic paracentesis. EXAM: ULTRASOUND GUIDED DIAGNOSTIC AND THERAPEUTIC PARACENTESIS MEDICATIONS: 10 mL 1% lidocaine COMPLICATIONS: None immediate. PROCEDURE: Informed written consent was obtained from the patient after a discussion of the risks, benefits and alternatives to treatment. A timeout was performed prior to the initiation of the procedure. Initial ultrasound scanning demonstrates a large amount of ascites within the right lower abdominal quadrant. The right lower abdomen was prepped and draped in the usual sterile fashion. 1% lidocaine was used for local anesthesia. Following this, a 6 Fr Safe-T-Centesis catheter was introduced. An ultrasound image was saved for documentation purposes. The paracentesis was performed. The catheter was removed and a dressing was applied. The patient tolerated the procedure well without immediate post procedural complication. FINDINGS: A total of approximately 6.0 liters of bloody fluid was removed. Samples were sent to the laboratory as requested by the clinical team. IMPRESSION: Successful ultrasound-guided diagnostic and therapeutic paracentesis yielding 6.0  liters of peritoneal fluid. Read by: Brynda Greathouse PA-C Electronically Signed   By: Lucrezia Europe M.D.   On: 08/05/2018 13:05    PERFORMANCE STATUS (ECOG) : 4 - Bedbound  Review of Systems Hiccups, Otherwise, a complete review of systems is negative.  Physical Exam General: frail appearing, NAD Lungs: Clear to auscultation bilaterally.  Heart: Regular rate and rhythm. Abdomen: Soft, nontender, less distended Musculoskeletal: trace edema Neuro: Alert, answering all questions appropriately. Cranial nerves grossly intact.  IMPRESSION: Patient says he feels better this AM. He is better able to move around in bed. He says he ate a large breakfast.   Spoke with Dr. Bridgett Larsson. Would recommend repeat paracentesis prior to discharge when he can hemodynamically tolerate the procedure (BP borderline low today). Given patient/family decision to pursue hospice, might consider Tenckhoff catheter to allow home management of ascites under hospice care.   Patient has persistent hiccups. Note that IV thorazine was ordered but this can cause hypotension. Also has ordered metoclopramide that can be given prn for hiccups.  Might also consider po baclofen, which can be well-tolerated in setting of advanced liver dz.   Patient denies pain and other distressing symptoms. He is comfortable appearing.   Chaplain reviewed HCPOA/living will documents. Patient reports that chaplain will come back later once sister is here to finish completing documents.   Child-Pugh Class C (score 10). MELD score 16.   PLAN: Hospice screening pending   Patient expressed understanding and was in agreement with this plan.    Time Total: 20 minutes  Visit consisted of counseling and education dealing with the complex and emotionally intense issues of symptom management and palliative care in the setting of serious and potentially life-threatening illness.Greater than 50%  of this time was spent counseling and coordinating care related  to the above assessment and plan.  Signed by: Altha Harm, [place], NP-C, [place] (Work Cell)

## 2018-08-10 ENCOUNTER — Ambulatory Visit: Payer: Medicare Other

## 2018-08-10 ENCOUNTER — Other Ambulatory Visit: Payer: Self-pay | Admitting: *Deleted

## 2018-08-10 ENCOUNTER — Inpatient Hospital Stay: Payer: Medicare Other

## 2018-08-10 ENCOUNTER — Other Ambulatory Visit: Payer: Medicare Other

## 2018-08-10 LAB — CBC
HEMATOCRIT: 22.6 % — AB (ref 39.0–52.0)
Hemoglobin: 7.2 g/dL — ABNORMAL LOW (ref 13.0–17.0)
MCH: 28.9 pg (ref 26.0–34.0)
MCHC: 31.9 g/dL (ref 30.0–36.0)
MCV: 90.8 fL (ref 80.0–100.0)
Platelets: 104 10*3/uL — ABNORMAL LOW (ref 150–400)
RBC: 2.49 MIL/uL — ABNORMAL LOW (ref 4.22–5.81)
RDW: 14.5 % (ref 11.5–15.5)
WBC: 6.1 10*3/uL (ref 4.0–10.5)
nRBC: 0 % (ref 0.0–0.2)

## 2018-08-10 LAB — BASIC METABOLIC PANEL
Anion gap: 6 (ref 5–15)
BUN: 35 mg/dL — AB (ref 8–23)
CO2: 27 mmol/L (ref 22–32)
CREATININE: 1.15 mg/dL (ref 0.61–1.24)
Calcium: 7.3 mg/dL — ABNORMAL LOW (ref 8.9–10.3)
Chloride: 96 mmol/L — ABNORMAL LOW (ref 98–111)
GFR calc Af Amer: >60 mL/min (ref >60)
Glucose, Bld: 102 mg/dL — ABNORMAL HIGH (ref 70–99)
Potassium: 3.7 mmol/L (ref 3.5–5.1)
SODIUM: 129 mmol/L — AB (ref 135–145)

## 2018-08-10 LAB — CULTURE, BODY FLUID W GRAM STAIN -BOTTLE: Culture: NO GROWTH

## 2018-08-10 LAB — PROTIME-INR
INR: 1.47
PROTHROMBIN TIME: 17.7 s — AB (ref 11.4–15.2)

## 2018-08-10 LAB — APTT: aPTT: 34 seconds (ref 24–36)

## 2018-08-10 MED ORDER — FENTANYL CITRATE (PF) 100 MCG/2ML IJ SOLN
INTRAMUSCULAR | Status: AC | PRN
  Administered 2018-08-10: 50 ug via INTRAVENOUS

## 2018-08-10 MED ORDER — FENTANYL CITRATE (PF) 100 MCG/2ML IJ SOLN
INTRAMUSCULAR | Status: AC
  Filled 2018-08-10: qty 4

## 2018-08-10 MED ORDER — LIDOCAINE HCL (PF) 1 % IJ SOLN
INTRAMUSCULAR | Status: AC | PRN
  Administered 2018-08-10: 20 mL

## 2018-08-10 MED ORDER — MIDAZOLAM HCL 5 MG/5ML IJ SOLN
INTRAMUSCULAR | Status: AC | PRN
  Administered 2018-08-10: 1 mg via INTRAVENOUS
  Administered 2018-08-10: 0.5 mg via INTRAVENOUS

## 2018-08-10 MED ORDER — IOPAMIDOL (ISOVUE-300) INJECTION 61%: 30.0000 mL | Freq: Once | INTRAVENOUS | Status: DC | PRN

## 2018-08-10 MED ORDER — FUROSEMIDE 10 MG/ML IJ SOLN
20.0000 mg | Freq: Two times a day (BID) | INTRAMUSCULAR | Status: DC
  Administered 2018-08-11: 20 mg via INTRAVENOUS
  Filled 2018-08-10: qty 4

## 2018-08-10 MED ORDER — MIDAZOLAM HCL 5 MG/5ML IJ SOLN
INTRAMUSCULAR | Status: AC
  Filled 2018-08-10: qty 5

## 2018-08-10 MED ORDER — CEFAZOLIN SODIUM-DEXTROSE 1-4 GM/50ML-% IV SOLN
INTRAVENOUS | Status: AC | PRN
  Administered 2018-08-10: 2 g via INTRAVENOUS

## 2018-08-10 MED ORDER — SODIUM CHLORIDE 0.9 % IV SOLN
INTRAVENOUS | Status: DC
  Administered 2018-08-10: 12:00:00 via INTRAVENOUS

## 2018-08-10 MED ORDER — LIDOCAINE HCL (PF) 1 % IJ SOLN
INTRAMUSCULAR | Status: AC
  Filled 2018-08-10: qty 30

## 2018-08-10 NOTE — Progress Notes (Signed)
Visit made to new referral for [facility] services at home. Patient seen lying in bed, alert and oriented. He is a 67 year old man with a known history of alcohol abuse, admitted to [facility] on 10/3 with abdominal pain, anasarca and possible sepsis. Abdominal CT revealed cirrhosis and several hepatic masses. He has also required paracentesis with a total of 12 liters drawn off. Palliative medicine was consulted for goals of care and have met with patient and his sister Santiago Glad. They have chosen to have patient return home with the support of hospice services. Plan is for placement of a Pleurex catheter today so draining can continue at home. Writer spoke on the phone with patient's sister Santiago Glad to initiate education regarding hospice services, philosophy and team approach to care with understanding voiced. Santiago Glad has requested a hospital bed. Bed to be delivered tomorrow as she has furniture to rearrange. Plan is for placement of a Pleurex catheter today so draining can continue at home. Hospital care team updated. Will continue to follow through discharge. Patient information faxed to referral.  Flo Shanks RN, BSN, [facility], hospital Liaison [phone]

## 2018-08-10 NOTE — Progress Notes (Signed)
Dr. Anselm Pancoast here at bedside in [facility] recovery . Examining pt. Right abdomen bruising extending to right lateral back. Aware of gross edema from abdomen to hips.

## 2018-08-10 NOTE — Progress Notes (Signed)
Report called to RN Vicente Males on 1A, pt to transport in his bed w/ his belongings and chart.  Vicente Males aware he needs stool specimen collected and NS started at 1200 for cath insertion at 1345.

## 2018-08-10 NOTE — Progress Notes (Signed)
Completed draining 6 liters ascites through abdominal PleurX cateter.  Bloody fluid removed.

## 2018-08-10 NOTE — Progress Notes (Signed)
[facility] NAME: Robert Woodward    MR#:  [id number]  DATE OF BIRTH:  14-Oct-1951  SUBJECTIVE:  CHIEF COMPLAINT:   Chief Complaint  Patient presents with  . Shortness of Breath  . Leg Swelling    Generalized edema  . Ascites   The patient still has abdominal distention, scrotal and leg swelling.  Status post paracentesis.  Better abdominal distention and scrotal edema. REVIEW OF SYSTEMS:  Review of Systems  Constitutional: Negative for chills, fever and malaise/fatigue.  HENT: Negative for sore throat.   Eyes: Negative for blurred vision and double vision.  Respiratory: Negative for cough, hemoptysis, shortness of breath, wheezing and stridor.   Cardiovascular: Positive for leg swelling. Negative for chest pain, palpitations and orthopnea.  Gastrointestinal: Negative for abdominal pain, blood in stool, diarrhea, melena, nausea and vomiting.       Abdominal distention.  Genitourinary: Negative for dysuria, flank pain and hematuria.  Musculoskeletal: Negative for back pain and joint pain.  Neurological: Negative for dizziness, sensory change, focal weakness, seizures, loss of consciousness, weakness and headaches.  Endo/Heme/Allergies: Negative for polydipsia.  Psychiatric/Behavioral: Negative for depression. The patient is not nervous/anxious.     DRUG ALLERGIES:   Allergies  Allergen Reactions  . Benadryl [Diphenhydramine] Rash  . Wool Alcohol [Lanolin] Rash   VITALS:  Blood pressure 99/63, pulse 81, temperature 97.9 F (36.6 C), temperature source Oral, resp. rate 18, height 5\' 11"  (1.803 m), weight 108.1 kg, SpO2 99 %. PHYSICAL EXAMINATION:  Physical Exam  Constitutional: He is oriented to person, place, and time. No distress.  HENT:  Head: Normocephalic.  Mouth/Throat: Oropharynx is clear and moist.  Eyes: Pupils are equal, round, and reactive to light. Conjunctivae and EOM are normal. No scleral icterus.    Neck: Normal range of motion. Neck supple. No JVD present. No tracheal deviation present.  Cardiovascular: Normal rate, regular rhythm and normal heart sounds. Exam reveals no gallop.  No murmur heard. Pulmonary/Chest: Effort normal and breath sounds normal. No respiratory distress. He has no wheezes. He has no rales.  Abdominal: Soft. Bowel sounds are normal. He exhibits distension. There is no tenderness. There is no rebound and no guarding.  Genitourinary:  Genitourinary Comments: Scrotal edema.  Musculoskeletal: Normal range of motion. He exhibits edema. He exhibits no tenderness.  Bilateral leg edema 2-3+.  Neurological: He is alert and oriented to person, place, and time. No cranial nerve deficit.  Skin: No rash noted. No erythema.  Psychiatric: He has a normal mood and affect.   LABORATORY PANEL:  Male CBC Recent Labs  Lab 08/10/18 0400  WBC 6.1  HGB 7.2*  HCT 22.6*  PLT 104*   ------------------------------------------------------------------------------------------------------------------ Chemistries  Recent Labs  Lab 08/06/18 0354  08/09/18 0853 08/10/18 0400  NA 134*   < > 129* 129*  K 3.5   < > 3.3* 3.7  CL 100   < > 94* 96*  CO2 27   < > 30 27  GLUCOSE 125*   < > 104* 102*  BUN 29*   < > 36* 35*  CREATININE 1.37*   < > 1.34* 1.15  CALCIUM 8.0*   < > 7.7* 7.3*  MG  --    < > 1.8  --   AST 82*  --   --   --   ALT 38  --   --   --   ALKPHOS 71  --   --   --  BILITOT 2.1*  --   --   --    < > = values in this interval not displayed.   RADIOLOGY:  [place] Intraoperative  Result Date: 08/10/2018 CLINICAL DATA:  Ultrasound was provided for use by the ordering physician, and a technical charge was applied by the performing facility.  No radiologist interpretation/professional services rendered.   Ir Image Guided Drainage By Percutaneous Catheter  Result Date: 08/10/2018 INDICATION: 67 year old with cirrhosis, liver lesions and large volume ascites. Patient is  going to hospice care and needs management for the ascites. Plan for placement of a tunneled peritoneal catheter. EXAM: PLACEMENT OF TUNNELED PERITONEAL CATHETER WITH ULTRASOUND AND FLUOROSCOPIC GUIDANCE MEDICATIONS: Ancef 2 g ANESTHESIA/SEDATION: Fentanyl 50 mcg IV; Versed 1.5 mg IV Moderate Sedation Time:  27 minutes The patient was continuously monitored during the procedure by the interventional radiology nurse under my direct supervision. COMPLICATIONS: None immediate. PROCEDURE: Informed written consent was obtained from the patient after a thorough discussion of the procedural risks, benefits and alternatives. All questions were addressed. A timeout was performed prior to the initiation of the procedure. Patient was placed supine on the interventional table. A large amount of volume was identified in the right lower quadrant with ultrasound. The right side of the abdomen was prepped and draped in sterile fashion. Maximal barrier sterile technique was utilized including caps, mask, sterile gowns, sterile gloves, sterile drape, hand hygiene and skin antiseptic. Two areas on the right abdomen were anesthetized with 1% lidocaine and 2 small incisions were made. A PleurX catheter was tunneled between these incisions. A needle was directed into the peritoneal space with ultrasound guidance along the lateral incision. Bloody ascites was aspirated. A wire was advanced in the peritoneal cavity and confirmed with ultrasound and fluoroscopy. The peritoneal access tract was dilated and a peel-away sheath was placed. The PleurX catheter was advanced through the peel-away sheath into the peritoneal cavity without difficulty. The peritoneal access site was closed using absorbable suture and Dermabond. The catheter was sutured to skin with Prolene suture. The cuff was placed underneath the skin. Total of 6 L of bloody ascites was removed. FINDINGS: Large volume of ascites. 6 L of bloody ascites was removed. Catheter  confirmed within the peritoneal space. IMPRESSION: Successful placement of a tunneled peritoneal catheter. Catheter is ready to be used. Electronically Signed   By: Markus Daft M.D.   On: 08/10/2018 16:04   ASSESSMENT AND PLAN:   This is a 67 year old male admitted for sepsis.  1.   Sepsis on admission likely due to underlying liver mass, anasarca and cirrhosis.  Hydrated with intravenous fluid which was discontinued due to anasarca and ascites. He was on Rocephin to cover SBP.  No evidence of SBP.  Changed to Cipro p.o. for prophylaxis.  2.  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  Evidence of portal hypertension as well. Positive HCV antibody.  Ascites, possible malignant Status post paracentesis with 6 L drawl. Repeated paracentesis with 6 L drawal. The patient needs to be drainage for recurrent ascites.  Tunneled peritoneal catheter placement with drainage of bloody ascites today.  3.  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 suspicious for [facility], He currently has significant ascites and liver biopsy is therefore challenging per [facility].  4.  Anasarca: With associated ascites.  Continue Lasix and spironolactone. Hospice is a reasonable consideration at this time per Dr. Janese Banks.   5.  DVT prophylaxis: Lovenox is discontinued due to concerns of dark brown emesis.  Acute renal failure due to dehydration.  Possible due to diuretics.  Follow-up BMP.  Lactic acidosis.  Improved.  Hyponatremia.  Worsening, follow-up BMP while on diuretics. Hypokalemia.  Continue potassium supplement.  Follow-up BMP.  Anemia of chronic disease and possible acute blood loss due to malignant bloody ascites.  Hemoglobin decreased to 7.2.  Follow-up hemoglobin, PRBC transfusion PRN.  Generalized weakness.  PT. Very poor prognosis, the patient will be discharged home with hospice care. I discussed with Josh, hospice care staff Santiago Glad. All the records are reviewed  and case discussed with Care Management/Social Worker. Management plans discussed with the patient, family and they are in agreement.  CODE STATUS: DNR  TOTAL TIME TAKING CARE OF THIS PATIENT: 35 minutes.   More than 50% of the time was spent in counseling/coordination of care: YES  POSSIBLE D/C IN ? DAYS, DEPENDING ON CLINICAL CONDITION.   Demetrios Loll M.D on 08/10/2018 at 4:29 PM  Between 7am to 6pm - Pager - [phone]  After 6pm go to [email or web address] - [facility]

## 2018-08-10 NOTE — Progress Notes (Signed)
[name]  [phone] [phone]   Name: Robert Woodward Date: 08/10/2018 MRN: [id number]  DOB: 01-Oct-1951  Patient Care Team: Patient, No Pcp Per as PCP - General (General Practice)    REASON FOR CONSULTATION: Palliative Care consult requested for this 67 y.o. male with history of chronic alcohol abuse since age 22 (stopped heavy drinking 2 years ago), who was admitted on 08/05/2018 with abdominal pain, anasarca, and possible sepsis.  Work-up including abdominal CT revealed cirrhosis and multiple hepatic masses concerning for metastatic disease or multifocal hepatocellular carcinoma.  Patient was also noted to have ascites and is status post large-volume paracentesis with 6 L of ascitic fluid drained.  Patient has been seen in consultation by oncology.  Palliative care has also been requested to help establish medical goals.    CODE STATUS: DNR  PAST MEDICAL HISTORY:History reviewed. No pertinent past medical history.  PAST SURGICAL HISTORY:  Past Surgical History:  Procedure Laterality Date  . ANKLE RECONSTRUCTION      HEMATOLOGY/ONCOLOGY HISTORY:   No history exists.    ALLERGIES:  is allergic to benadryl [diphenhydramine] and wool alcohol [lanolin].  MEDICATIONS:  Current Facility-Administered Medications  Medication Dose Route Frequency Provider Last Rate Last Dose  . 0.9 %  sodium chloride infusion   Intravenous Continuous Markus Daft, MD 10 mL/hr at 08/10/18 1202    . acetaminophen (TYLENOL) tablet 650 mg  650 mg Oral Q6H PRN Harrie Foreman, MD   650 mg at 08/08/18 1154   Or  . acetaminophen (TYLENOL) suppository 650 mg  650 mg Rectal Q6H PRN Harrie Foreman, MD      . bisacodyl (DULCOLAX) EC tablet 5 mg  5 mg Oral Daily PRN Demetrios Loll, MD      . calcium carbonate (TUMS - dosed in mg elemental calcium) chewable tablet 200 mg of elemental calcium  1 tablet Oral PRN Harrie Foreman, MD      .  chlorproMAZINE (THORAZINE) tablet 25 mg  25 mg Oral TID PRN Lance Coon, MD   25 mg at 08/09/18 2225  . ciprofloxacin (CIPRO) tablet 500 mg  500 mg Oral Q breakfast Demetrios Loll, MD   500 mg at 08/10/18 0901  . cyclobenzaprine (FLEXERIL) tablet 5 mg  5 mg Oral TID PRN Demetrios Loll, MD      . docusate sodium (COLACE) capsule 100 mg  100 mg Oral BID Harrie Foreman, MD   100 mg at 08/10/18 0902  . fentaNYL (SUBLIMAZE) 100 MCG/2ML injection           . furosemide (LASIX) injection 20 mg  20 mg Intravenous Q12H Demetrios Loll, MD      . iopamidol (ISOVUE-300) 61 % injection 30 mL  30 mL Intravenous Once PRN Markus Daft, MD      . metoCLOPramide (REGLAN) injection 10 mg  10 mg Intravenous Q8H PRN Demetrios Loll, MD   10 mg at 08/06/18 1031  . midazolam (VERSED) 5 MG/5ML injection           . morphine 4 MG/ML injection 4 mg  4 mg Intravenous Q4H PRN Demetrios Loll, MD      . ondansetron [facility]) tablet 4 mg  4 mg Oral Q6H PRN Harrie Foreman, MD       Or  . ondansetron [facility]) injection 4 mg  4 mg Intravenous Q6H PRN Harrie Foreman, MD   4 mg at 08/07/18 1545  . pantoprazole (PROTONIX) EC  tablet 40 mg  40 mg Oral Daily Ragen Laver, Kirt Boys, NP   40 mg at 08/10/18 0901  . potassium chloride SA (K-DUR,KLOR-CON) CR tablet 40 mEq  40 mEq Oral Daily Demetrios Loll, MD   40 mEq at 08/10/18 0901  . sodium chloride flush (NS) 0.9 % injection 3 mL  3 mL Intravenous Q12H Demetrios Loll, MD   3 mL at 08/10/18 0902  . sodium chloride flush (NS) 0.9 % injection 3 mL  3 mL Intravenous PRN Demetrios Loll, MD   3 mL at 08/08/18 1819  . spironolactone (ALDACTONE) tablet 100 mg  100 mg Oral Daily Harrie Foreman, MD   100 mg at 08/10/18 0901  . traMADol (ULTRAM) tablet 50 mg  50 mg Oral Q6H PRN Demetrios Loll, MD        VITAL SIGNS: BP 99/63 (BP Location: Right Arm)   Pulse 81   Temp 97.9 F (36.6 C) (Oral)   Resp 18   Ht 5\' 11"  (1.803 m)   Wt 238 lb 5.1 oz (108.1 kg)   SpO2 99%   BMI 33.24 kg/m  Filed Weights   08/09/18 0444  08/10/18 0432 08/10/18 1250  Weight: 243 lb 13.3 oz (110.6 kg) 238 lb 8 oz (108.2 kg) 238 lb 5.1 oz (108.1 kg)    Estimated body mass index is 33.24 kg/m as calculated from the following:   Height as of this encounter: 5\' 11"  (1.803 m).   Weight as of this encounter: 238 lb 5.1 oz (108.1 kg).  LABS: CBC:    Component Value Date/Time   WBC 6.1 08/10/2018 0400   HGB 7.2 (L) 08/10/2018 0400   HCT 22.6 (L) 08/10/2018 0400   PLT 104 (L) 08/10/2018 0400   MCV 90.8 08/10/2018 0400   Comprehensive Metabolic Panel:    Component Value Date/Time   NA 129 (L) 08/10/2018 0400   K 3.7 08/10/2018 0400   CL 96 (L) 08/10/2018 0400   CO2 27 08/10/2018 0400   BUN 35 (H) 08/10/2018 0400   CREATININE 1.15 08/10/2018 0400   GLUCOSE 102 (H) 08/10/2018 0400   CALCIUM 7.3 (L) 08/10/2018 0400   AST 82 (H) 08/06/2018 0354   ALT 38 08/06/2018 0354   ALKPHOS 71 08/06/2018 0354   BILITOT 2.1 (H) 08/06/2018 0354   PROT 5.5 (L) 08/06/2018 0354   ALBUMIN 2.6 (L) 08/06/2018 0354    RADIOGRAPHIC STUDIES: Dg Chest 2 View  Result Date: 08/04/2018 CLINICAL DATA:  Abdominal distension and diffuse body edema for the past 3 weeks. Dyspnea on exertion. EXAM: CHEST - 2 VIEW COMPARISON:  02/08/2018. FINDINGS: Normal sized heart. Clear lungs. Diffuse osteopenia. Mild thoracic spine degenerative changes. IMPRESSION: No acute abnormality. Electronically Signed   By: Claudie Revering M.D.   On: 08/04/2018 21:04   Ct Chest W Contrast  Result Date: 08/05/2018 CLINICAL DATA:  Liver cancer staging. Tightness and pain in the abdomen. EXAM: CT CHEST WITH CONTRAST TECHNIQUE: Multidetector CT imaging of the chest was performed during intravenous contrast administration. CONTRAST:  60mL OMNIPAQUE IOHEXOL 300 MG/ML  SOLN COMPARISON:  CT abdomen pelvis 08/04/2018. FINDINGS: Cardiovascular: Atherosclerotic calcification of the arterial vasculature, including three-vessel involvement of the coronary arteries. Heart size normal. No  pericardial effusion. Mediastinum/Nodes: Triangular-shaped prevascular soft tissue lesion may represent thymus, measuring 1.5 cm in short axis. Additional mediastinal and hilar lymph nodes are subcentimeter in short axis size. No hilar or axillary adenopathy. There may be distal esophageal wall thickening. Lungs/Pleura: Minimal mucoid impaction in the left  lower lobe. Lungs are otherwise clear. No pleural fluid. Adherent debris in the trachea. Upper Abdomen: Liver is shrunken and irregular and contains heterogeneous masses, better visualized on yesterday's exam. Upper abdominal lymph nodes measure up to 1.2 cm in the portacaval station. Gallstones. Visualized portions of the adrenal glands, kidneys, spleen, pancreas, stomach and bowel are otherwise grossly unremarkable. Large ascites. Musculoskeletal: Degenerative changes in the spine. Patchy sclerosis in the lateral aspects of the seventh ribs bilaterally, which may be posttraumatic in etiology, given symmetry. Additional age indeterminate fractures are seen in the right posterolateral eighth and ninth ribs. IMPRESSION: 1. No evidence of primary malignancy or metastatic disease in the chest. 2. Possible hyperplastic thymic tissue in the prevascular space. Adenopathy is not excluded. Continued attention on follow-up exams is warranted. 3. Cirrhosis with heterogeneous hepatic masses, better seen on 08/04/2018. 4. Large ascites. 5. Aortic atherosclerosis (ICD10-170.0). Three-vessel coronary artery calcification. 6. Cholelithiasis. Electronically Signed   By: Lorin Picket M.D.   On: 08/05/2018 12:48   Mr Abdomen W Wo Contrast  Addendum Date: 08/05/2018   ADDENDUM REPORT: 08/05/2018 14:52 ADDENDUM: The large enhancing lesions have early arterial enhancement and washout typical of hepatocellular carcinoma (LI-RADS 5 categorization). Electronically Signed   By: Suzy Bouchard M.D.   On: 08/05/2018 14:52   Result Date: 08/05/2018 CLINICAL DATA:  Hepatic masses.  EXAM: MRI ABDOMEN WITHOUT AND WITH CONTRAST TECHNIQUE: Multiplanar multisequence MR imaging of the abdomen was performed both before and after the administration of intravenous contrast. CONTRAST:  7 mL Gadavist COMPARISON:  CT 08/04/2018 FINDINGS: Lower chest:  Lung bases are clear. Hepatobiliary: Multiple round masses in the liver are solid-appearing and demonstrate uniform arterial enhancement. Largest lesion the RIGHT hepatic lobe measures 8 cm (image 21/13). Smaller partially exophytic lesion from the posterolateral RIGHT hepatic lobe measures 3.7 cm. There is lesion in the LEFT lateral hepatic lobe measuring 6.0 cm. Large lesion in the posterior RIGHT lobe liver measuring 7.7 cm. Underlying liver itself is shrunken. There is nodular contour to the liver. The portal veins are patent. There is a large volume of intraperitoneal free fluid suggesting ascites. No biliary duct dilatation. Pancreas: No pancreatic lesion.  No pancreatic ductal dilatation. Spleen: Spleen is normal volume. Adrenals/urinary tract: Adrenal glands and kidneys are normal. Stomach/Bowel: Stomach and limited of the small bowel is unremarkable Vascular/Lymphatic: Abdominal aortic normal caliber. No retroperitoneal periportal lymphadenopathy. Musculoskeletal: No aggressive osseous lesion. Schmorl's node at L1. IMPRESSION: 1. Large round enhancing lesions on the background cirrhotic morphology of the liver is most suggestive of multifocal hepatocellular carcinoma. 2. Large volume of intraperitoneal free fluid ascites associated with liver dysfunction. 3. Portal veins are patent.  Spleen normal volume. Electronically Signed: By: Suzy Bouchard M.D. On: 08/05/2018 13:11   [place] Intraoperative  Result Date: 08/10/2018 CLINICAL DATA:  Ultrasound was provided for use by the ordering physician, and a technical charge was applied by the performing facility.  No radiologist interpretation/professional services rendered.   Ct Abdomen Pelvis W  Contrast  Result Date: 08/04/2018 CLINICAL DATA:  Abdominal distension with swelling EXAM: CT ABDOMEN AND PELVIS WITH CONTRAST TECHNIQUE: Multidetector CT imaging of the abdomen and pelvis was performed using the standard protocol following bolus administration of intravenous contrast. CONTRAST:  155mL ISOVUE-300 IOPAMIDOL (ISOVUE-300) INJECTION 61% COMPARISON:  None. FINDINGS: Lower chest: Lung bases demonstrate no acute consolidation or pleural effusion. The heart size is within normal limits. Small moderate hiatal hernia with fluid in the hernia sac. Hepatobiliary: Nodular liver contour consistent with cirrhosis. Multiple  suspicious liver masses measuring up to 8.6 cm in size. Calcified gallstones. No biliary dilatation Pancreas: Unremarkable. No pancreatic ductal dilatation or surrounding inflammatory changes. Spleen: Enlarged, coronal measurement of 15.5 cm. Vague hypodensity within the posterior spleen. Adrenals/Urinary Tract: Adrenal glands are within normal limits. No hydronephrosis. Bladder within normal limits. Stomach/Bowel: No bowel dilatation or bowel wall thickening. Centralization of bowel loops. Negative appendix. Vascular/Lymphatic: Moderate aortic atherosclerosis. No aneurysmal dilatation. Subcentimeter gastrohepatic lymph nodes. Reproductive: Prostate is unremarkable. Other: No free air. Massive ascites in the abdomen and pelvis. Generalized subcutaneous edema Musculoskeletal: Chronic appearing superior endplate deformity at L1 with Schmorl's node. No suspicious bone lesion. IMPRESSION: 1. 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 2. Massive quantity of ascites within the abdomen and pelvis 3. Splenomegaly. Vague hypodensity in the posterior spleen, not sure if this is due to heterogeneous enhancement or a vague hypodense mass in the spleen. 4. Gallstones 5. Anasarca Electronically Signed   By: Donavan Foil M.D.    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 Maximum amount is 6 L. Prior paracentesis on 08/05/2018. EXAM: ULTRASOUND GUIDED PARACENTESIS MEDICATIONS: None. COMPLICATIONS: None immediate. PROCEDURE: Informed written consent was obtained from the patient after a discussion of the risks, benefits and alternatives to treatment. A timeout was performed prior to the initiation of the procedure. Initial ultrasound scanning demonstrates a large amount of ascites within the right lower abdominal quadrant. The right lower abdomen was prepped and draped in the usual sterile fashion. 1% lidocaine was used for local anesthesia. Following this, a 6 Fr Safe-T-Centesis catheter was introduced. An ultrasound image was saved for documentation purposes. The paracentesis was performed. The catheter was removed and a dressing was applied. The patient tolerated the procedure well without immediate post procedural complication. FINDINGS: A total of approximately 6 L of bloody fluid was removed. Large amount of residual ascites after the 6 L were removed. IMPRESSION: Successful ultrasound-guided paracentesis yielding 6 liters of peritoneal fluid. Electronically Signed   By: Markus Daft M.D.   On: 08/08/2018 13:08   US Paracentesis  Result Date: 08/05/2018 INDICATION: Patient with abdominal distention, ascites. Request is made for diagnostic and therapeutic paracentesis. EXAM: ULTRASOUND GUIDED DIAGNOSTIC AND THERAPEUTIC PARACENTESIS MEDICATIONS: 10 mL 1% lidocaine COMPLICATIONS: None immediate. PROCEDURE: Informed written consent was obtained from the patient after a discussion of the risks, benefits and alternatives to treatment. A timeout was performed prior to the initiation of the procedure. Initial ultrasound scanning demonstrates a large amount of ascites within the right lower abdominal quadrant. The right lower abdomen was prepped and  draped in the usual sterile fashion. 1% lidocaine was used for local anesthesia. Following this, a 6 Fr Safe-T-Centesis catheter was introduced. An ultrasound image was saved for documentation purposes. The paracentesis was performed. The catheter was removed and a dressing was applied. The patient tolerated the procedure well without immediate post procedural complication. FINDINGS: A total of approximately 6.0 liters of bloody fluid was removed. Samples were sent to the laboratory as requested by the clinical team. IMPRESSION: Successful ultrasound-guided diagnostic and therapeutic paracentesis yielding 6.0 liters of peritoneal fluid. Read by: Brynda Greathouse PA-C Electronically Signed   By: Lucrezia Europe M.D.   On: 08/05/2018 13:05   Ir Image Guided Drainage By Percutaneous Catheter  Result Date: 08/10/2018 INDICATION: 67 year old with cirrhosis, liver lesions and large volume ascites. Patient is going to hospice care  and needs management for the ascites. Plan for placement of a tunneled peritoneal catheter. EXAM: PLACEMENT OF TUNNELED PERITONEAL CATHETER WITH ULTRASOUND AND FLUOROSCOPIC GUIDANCE MEDICATIONS: Ancef 2 g ANESTHESIA/SEDATION: Fentanyl 50 mcg IV; Versed 1.5 mg IV Moderate Sedation Time:  27 minutes The patient was continuously monitored during the procedure by the interventional radiology nurse under my direct supervision. COMPLICATIONS: None immediate. PROCEDURE: Informed written consent was obtained from the patient after a thorough discussion of the procedural risks, benefits and alternatives. All questions were addressed. A timeout was performed prior to the initiation of the procedure. Patient was placed supine on the interventional table. A large amount of volume was identified in the right lower quadrant with ultrasound. The right side of the abdomen was prepped and draped in sterile fashion. Maximal barrier sterile technique was utilized including caps, mask, sterile gowns, sterile gloves,  sterile drape, hand hygiene and skin antiseptic. Two areas on the right abdomen were anesthetized with 1% lidocaine and 2 small incisions were made. A PleurX catheter was tunneled between these incisions. A needle was directed into the peritoneal space with ultrasound guidance along the lateral incision. Bloody ascites was aspirated. A wire was advanced in the peritoneal cavity and confirmed with ultrasound and fluoroscopy. The peritoneal access tract was dilated and a peel-away sheath was placed. The PleurX catheter was advanced through the peel-away sheath into the peritoneal cavity without difficulty. The peritoneal access site was closed using absorbable suture and Dermabond. The catheter was sutured to skin with Prolene suture. The cuff was placed underneath the skin. Total of 6 L of bloody ascites was removed. FINDINGS: Large volume of ascites. 6 L of bloody ascites was removed. Catheter confirmed within the peritoneal space. IMPRESSION: Successful placement of a tunneled peritoneal catheter. Catheter is ready to be used. Electronically Signed   By: Markus Daft M.D.   On: 08/10/2018 16:04    PERFORMANCE STATUS (ECOG) : 4 - Bedbound  Review of Systems Hiccups, Otherwise, a complete review of systems is negative.  Physical Exam General: frail appearing, NAD Lungs: Clear to auscultation bilaterally.  Heart: Regular rate and rhythm. Abdomen: Soft, nontender, less distended, catheter noted but not visualized Musculoskeletal: trace edema Neuro: Alert, answering all questions appropriately. Cranial nerves grossly intact.  IMPRESSION: Patient is s/p Tenckhoff catheter placement today with 1L of ascitic fluid drained.  Patient is currently resting and comfortable. He says he is hungry and ready for dinner.   Case discussed with hospice liaison who is helping with his discharge planning.    PLAN: Home with hospice when medically appropriate.    Patient expressed understanding and was in  agreement with this plan.    Time Total: 15 minutes  Visit consisted of counseling and education dealing with the complex and emotionally intense issues of symptom management and palliative care in the setting of serious and potentially life-threatening illness.Greater than 50%  of this time was spent counseling and coordinating care related to the above assessment and plan.  Signed by: Altha Harm, [place], NP-C, [place] (Work Cell)

## 2018-08-10 NOTE — Care Management (Addendum)
Patient transferred from 2A to 1A. CM informed that it is anticipated patient will discharge home 10.10 under hospice care with [facility]  It is reported that he may discharge the the home of his sister but CM unable to confirm as patient is sleeping when CM attempted to discussed. During this admission patient has had approximately 12 liters of fluid removed during paracentesis.  Pleurex catheter will be placed for discharge. Will need bottles to take home. Hospice will provide more.

## 2018-08-10 NOTE — Progress Notes (Signed)
Physical Therapy Treatment Patient Details Name: Robert Woodward MRN: [id number] DOB: May 26, 1951 Today's Date: 08/10/2018    History of Present Illness Pt is a 67 yo male with no chronic medical problems presented to the emergency department complaining of abdominal pain and scrotal swelling.  The patient also admits to lower extremity edema and numbness in his feet.  He stated that he had been in so much pain that has been unable to get out of bed for 2 days.  In the emergency department patient was found to have a very tensely swollen abdomen.  CT of the abdomen revealed cirrhotic liver as well as multiple areas of hyperdensity concerning for metastases.  He was also found to have elevated troponin as well as significant lactic acidosis prompted the emergency department staff to the hospital service for admission.  Assessment includes: Sepsis, cirrhosis, ascites, liver masses, anasarca, and ARF due to dehydration.      PT Comments    Pt agreeable to PT; reports 9/10 pain in R ankle. Pt participates well with supine bed exercises and offers that he is trying to "move and strengthen legs". Attempted sitting edge of bed with Max A; unable to fully reach edge of bed sitting comfortably; Max A to return and position. Continue PT to progress strength, endurance to improve functional mobility.    Follow Up Recommendations        Equipment Recommendations       Recommendations for Other Services       Precautions / Restrictions Precautions Precautions: Fall Restrictions Weight Bearing Restrictions: No    Mobility  Bed Mobility Overal bed mobility: Needs Assistance Bed Mobility: Supine to Sit;Sit to Supine     Supine to sit: Max assist Sit to supine: Max assist   General bed mobility comments: Doesnt quite get to full sitting to edge of bed.   Transfers                    Ambulation/Gait                 Stairs             Wheelchair Mobility    Modified  Rankin (Stroke Patients Only)       Balance                                            Cognition Arousal/Alertness: Awake/alert Behavior During Therapy: WFL for tasks assessed/performed Overall Cognitive Status: Within Functional Limits for tasks assessed                                        Exercises General Exercises - Lower Extremity Ankle Circles/Pumps: AROM;15 reps;Supine;Both Quad Sets: Strengthening;Both;20 reps;Supine Gluteal Sets: Strengthening;Both;20 reps;Supine Short Arc Quad: AROM;Both;20 reps;Supine Heel Slides: AROM;Both;20 reps;Supine Hip ABduction/ADduction: AAROM;Both;20 reps;Supine Straight Leg Raises: AAROM;Both;20 reps;Supine    General Comments        Pertinent Vitals/Pain Pain Assessment: 0-10 Pain Score: 9  Pain Location: R ankle Pain Descriptors / Indicators: Constant;Sharp Pain Intervention(s): Monitored during session;Limited activity within patient's tolerance    Home Living                      Prior Function  PT Goals (current goals can now be found in the care plan section) Progress towards PT goals: Progressing toward goals(slowly)    Frequency    Min 2X/week      PT Plan Current plan remains appropriate    Co-evaluation              AM-PAC PT "6 Clicks" Daily Activity  Outcome Measure  Difficulty turning over in bed (including adjusting bedclothes, sheets and blankets)?: Unable Difficulty moving from lying on back to sitting on the side of the bed? : Unable Difficulty sitting down on and standing up from a chair with arms (e.g., wheelchair, bedside commode, etc,.)?: Unable Help needed moving to and from a bed to chair (including a wheelchair)?: Total Help needed walking in hospital room?: Total Help needed climbing 3-5 steps with a railing? : Total 6 Click Score: 6    End of Session   Activity Tolerance: Patient tolerated treatment well;Other  (comment)(weakness) Patient left: in bed;with call bell/phone within reach;with bed alarm set   PT Visit Diagnosis: Muscle weakness (generalized) (M62.81);Difficulty in walking, not elsewhere classified (R26.2)     Time: 7169-6789 PT Time Calculation (min) (ACUTE ONLY): 35 min  Charges:  $Therapeutic Exercise: 8-22 mins $Therapeutic Activity: 8-22 mins                      Larae Grooms, PTA 08/10/2018, 12:42 PM

## 2018-08-10 NOTE — Plan of Care (Signed)

## 2018-08-10 NOTE — Consult Note (Signed)
Chief Complaint: Patient was seen in consultation today for  Chief Complaint  Patient presents with  . Shortness of Breath  . Leg Swelling    Generalized edema  . Ascites    Referring Physician(s): Demetrios Loll   Patient Status: [name] - In-pt  History of Present Illness: Robert Woodward is a 67 y.o. male with cirrhosis and liver lesions.  Patient recently presented with scrotal and leg swelling.  Imaging demonstrated a large amount of ascites and liver lesions.  Patient has had 2 ultrasound-guided paracentesis with drainage of 6 L of bloody fluid both times.  Patient has been evaluated by oncology and palliative care.  Patient is going to hospice care and a tunneled peritoneal catheter has been requested management of the large volume ascites.  Patient is aware of his medical condition and the palliative care management.  Patient continues to complain of abdominal distention and scrotal swelling.  Peritoneal fluid cultures have been negative.  Hepatitis C antibody is elevated.    Past Surgical History:  Procedure Laterality Date  . ANKLE RECONSTRUCTION      Allergies: Benadryl [diphenhydramine] and Wool alcohol [lanolin]  Medications: Prior to Admission medications   Medication Sig Start Date End Date Taking? Authorizing Provider  calcium carbonate (TUMS - DOSED IN MG ELEMENTAL CALCIUM) 500 MG chewable tablet Chew 1 tablet by mouth as needed for indigestion or heartburn.   Yes [provider]     Family History  Problem Relation Age of Onset  . Diabetes Mellitus II Father   . Diabetes Mellitus II Sister   . Diabetes Mellitus II Brother     Social History   Socioeconomic History  . Marital status: Legally Separated    Spouse name: Not on file  . Number of children: Not on file  . Years of education: Not on file  . Highest education level: Not on file  Occupational History  . Not on file  Social Needs  . Financial resource strain: Not on file  . Food  insecurity:    Worry: Not on file    Inability: Not on file  . Transportation needs:    Medical: Not on file    Non-medical: Not on file  Tobacco Use  . Smoking status: Current Some Day Smoker    Types: Cigarettes  . Smokeless tobacco: Never Used  . Tobacco comment: a cigarette every 3 days  Substance and Sexual Activity  . Alcohol use: Not Currently  . Drug use: Not Currently  . Sexual activity: Not on file  Lifestyle  . Physical activity:    Days per week: Not on file    Minutes per session: Not on file  . Stress: Not on file  Relationships  . Social connections:    Talks on phone: Not on file    Gets together: Not on file    Attends religious service: Not on file    Active member of club or organization: Not on file    Attends meetings of clubs or organizations: Not on file    Relationship status: Not on file  Other Topics Concern  . Not on file  Social History Narrative  . Not on file      Review of Systems  Respiratory: Negative.   Cardiovascular: Positive for leg swelling.  Gastrointestinal: Positive for abdominal distention.  Genitourinary: Positive for scrotal swelling.    Vital Signs: BP 110/74 (BP Location: Right Arm)   Pulse 89   Temp 97.6  F (36.4 C) (Oral)   Resp 18   Ht 5\' 11"  (1.803 m)   Wt 108.1 kg   SpO2 99%   BMI 33.24 kg/m   Physical Exam  Constitutional: No distress.  HENT:  Mouth/Throat: Oropharynx is clear and moist.  Pulmonary/Chest: Effort normal. No respiratory distress. He has decreased breath sounds.  Abdominal: He exhibits distension and ascites.  Skin glue over 2 small incisions in the right lateral abdomen from recent paracentesis.  Large amount of ecchymosis along the lateral aspect of the right abdomen extending into the right back.  Genitourinary:  Genitourinary Comments: Scrotal swelling.    Imaging: Dg Chest 2 View  Result Date: 08/04/2018 CLINICAL DATA:  Abdominal distension and diffuse body edema for the past  3 weeks. Dyspnea on exertion. EXAM: CHEST - 2 VIEW COMPARISON:  02/08/2018. FINDINGS: Normal sized heart. Clear lungs. Diffuse osteopenia. Mild thoracic spine degenerative changes. IMPRESSION: No acute abnormality. Electronically Signed   By: Claudie Revering M.D.   On: 08/04/2018 21:04   Ct Chest W Contrast  Result Date: 08/05/2018 CLINICAL DATA:  Liver cancer staging. Tightness and pain in the abdomen. EXAM: CT CHEST WITH CONTRAST TECHNIQUE: Multidetector CT imaging of the chest was performed during intravenous contrast administration. CONTRAST:  83mL OMNIPAQUE IOHEXOL 300 MG/ML  SOLN COMPARISON:  CT abdomen pelvis 08/04/2018. FINDINGS: Cardiovascular: Atherosclerotic calcification of the arterial vasculature, including three-vessel involvement of the coronary arteries. Heart size normal. No pericardial effusion. Mediastinum/Nodes: Triangular-shaped prevascular soft tissue lesion may represent thymus, measuring 1.5 cm in short axis. Additional mediastinal and hilar lymph nodes are subcentimeter in short axis size. No hilar or axillary adenopathy. There may be distal esophageal wall thickening. Lungs/Pleura: Minimal mucoid impaction in the left lower lobe. Lungs are otherwise clear. No pleural fluid. Adherent debris in the trachea. Upper Abdomen: Liver is shrunken and irregular and contains heterogeneous masses, better visualized on yesterday's exam. Upper abdominal lymph nodes measure up to 1.2 cm in the portacaval station. Gallstones. Visualized portions of the adrenal glands, kidneys, spleen, pancreas, stomach and bowel are otherwise grossly unremarkable. Large ascites. Musculoskeletal: Degenerative changes in the spine. Patchy sclerosis in the lateral aspects of the seventh ribs bilaterally, which may be posttraumatic in etiology, given symmetry. Additional age indeterminate fractures are seen in the right posterolateral eighth and ninth ribs. IMPRESSION: 1. No evidence of primary malignancy or metastatic  disease in the chest. 2. Possible hyperplastic thymic tissue in the prevascular space. Adenopathy is not excluded. Continued attention on follow-up exams is warranted. 3. Cirrhosis with heterogeneous hepatic masses, better seen on 08/04/2018. 4. Large ascites. 5. Aortic atherosclerosis (ICD10-170.0). Three-vessel coronary artery calcification. 6. Cholelithiasis. Electronically Signed   By: Lorin Picket M.D.   On: 08/05/2018 12:48   Mr Abdomen W Wo Contrast  Addendum Date: 08/05/2018   ADDENDUM REPORT: 08/05/2018 14:52 ADDENDUM: The large enhancing lesions have early arterial enhancement and washout typical of hepatocellular carcinoma (LI-RADS 5 categorization). Electronically Signed   By: Suzy Bouchard M.D.   On: 08/05/2018 14:52   Result Date: 08/05/2018 CLINICAL DATA:  Hepatic masses. EXAM: MRI ABDOMEN WITHOUT AND WITH CONTRAST TECHNIQUE: Multiplanar multisequence MR imaging of the abdomen was performed both before and after the administration of intravenous contrast. CONTRAST:  7 mL Gadavist COMPARISON:  CT 08/04/2018 FINDINGS: Lower chest:  Lung bases are clear. Hepatobiliary: Multiple round masses in the liver are solid-appearing and demonstrate uniform arterial enhancement. Largest lesion the RIGHT hepatic lobe measures 8 cm (image 21/13). Smaller partially exophytic lesion from  the posterolateral RIGHT hepatic lobe measures 3.7 cm. There is lesion in the LEFT lateral hepatic lobe measuring 6.0 cm. Large lesion in the posterior RIGHT lobe liver measuring 7.7 cm. Underlying liver itself is shrunken. There is nodular contour to the liver. The portal veins are patent. There is a large volume of intraperitoneal free fluid suggesting ascites. No biliary duct dilatation. Pancreas: No pancreatic lesion.  No pancreatic ductal dilatation. Spleen: Spleen is normal volume. Adrenals/urinary tract: Adrenal glands and kidneys are normal. Stomach/Bowel: Stomach and limited of the small bowel is unremarkable  Vascular/Lymphatic: Abdominal aortic normal caliber. No retroperitoneal periportal lymphadenopathy. Musculoskeletal: No aggressive osseous lesion. Schmorl's node at L1. IMPRESSION: 1. Large round enhancing lesions on the background cirrhotic morphology of the liver is most suggestive of multifocal hepatocellular carcinoma. 2. Large volume of intraperitoneal free fluid ascites associated with liver dysfunction. 3. Portal veins are patent.  Spleen normal volume. Electronically Signed: By: Suzy Bouchard M.D. On: 08/05/2018 13:11   Ct Abdomen Pelvis W Contrast  Result Date: 08/04/2018 CLINICAL DATA:  Abdominal distension with swelling EXAM: CT ABDOMEN AND PELVIS WITH CONTRAST TECHNIQUE: Multidetector CT imaging of the abdomen and pelvis was performed using the standard protocol following bolus administration of intravenous contrast. CONTRAST:  139mL ISOVUE-300 IOPAMIDOL (ISOVUE-300) INJECTION 61% COMPARISON:  None. FINDINGS: Lower chest: Lung bases demonstrate no acute consolidation or pleural effusion. The heart size is within normal limits. Small moderate hiatal hernia with fluid in the hernia sac. Hepatobiliary: Nodular liver contour consistent with cirrhosis. Multiple suspicious liver masses measuring up to 8.6 cm in size. Calcified gallstones. No biliary dilatation Pancreas: Unremarkable. No pancreatic ductal dilatation or surrounding inflammatory changes. Spleen: Enlarged, coronal measurement of 15.5 cm. Vague hypodensity within the posterior spleen. Adrenals/Urinary Tract: Adrenal glands are within normal limits. No hydronephrosis. Bladder within normal limits. Stomach/Bowel: No bowel dilatation or bowel wall thickening. Centralization of bowel loops. Negative appendix. Vascular/Lymphatic: Moderate aortic atherosclerosis. No aneurysmal dilatation. Subcentimeter gastrohepatic lymph nodes. Reproductive: Prostate is unremarkable. Other: No free air. Massive ascites in the abdomen and pelvis. Generalized  subcutaneous edema Musculoskeletal: Chronic appearing superior endplate deformity at L1 with Schmorl's node. No suspicious bone lesion. IMPRESSION: 1. 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 2. Massive quantity of ascites within the abdomen and pelvis 3. Splenomegaly. Vague hypodensity in the posterior spleen, not sure if this is due to heterogeneous enhancement or a vague hypodense mass in the spleen. 4. Gallstones 5. Anasarca Electronically Signed   By: Donavan Foil M.D.   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 Maximum amount is 6 L. Prior paracentesis on 08/05/2018. EXAM: ULTRASOUND GUIDED PARACENTESIS MEDICATIONS: None. COMPLICATIONS: None immediate. PROCEDURE: Informed written consent was obtained from the patient after a discussion of the risks, benefits and alternatives to treatment. A timeout was performed prior to the initiation of the procedure. Initial ultrasound scanning demonstrates a large amount of ascites within the right lower abdominal quadrant. The right lower abdomen was prepped and draped in the usual sterile fashion. 1% lidocaine was used for local anesthesia. Following this, a 6 Fr Safe-T-Centesis catheter was introduced. An ultrasound image was saved for documentation purposes. The paracentesis was performed. The catheter was removed and a dressing was applied. The patient tolerated the procedure well without immediate post procedural complication. FINDINGS: A total of approximately 6 L of bloody fluid was removed. Large amount of residual ascites  after the 6 L were removed. IMPRESSION: Successful ultrasound-guided paracentesis yielding 6 liters of peritoneal fluid. Electronically Signed   By: Markus Daft M.D.   On: 08/08/2018 13:08   US Paracentesis  Result Date: 08/05/2018 INDICATION: Patient with abdominal  distention, ascites. Request is made for diagnostic and therapeutic paracentesis. EXAM: ULTRASOUND GUIDED DIAGNOSTIC AND THERAPEUTIC PARACENTESIS MEDICATIONS: 10 mL 1% lidocaine COMPLICATIONS: None immediate. PROCEDURE: Informed written consent was obtained from the patient after a discussion of the risks, benefits and alternatives to treatment. A timeout was performed prior to the initiation of the procedure. Initial ultrasound scanning demonstrates a large amount of ascites within the right lower abdominal quadrant. The right lower abdomen was prepped and draped in the usual sterile fashion. 1% lidocaine was used for local anesthesia. Following this, a 6 Fr Safe-T-Centesis catheter was introduced. An ultrasound image was saved for documentation purposes. The paracentesis was performed. The catheter was removed and a dressing was applied. The patient tolerated the procedure well without immediate post procedural complication. FINDINGS: A total of approximately 6.0 liters of bloody fluid was removed. Samples were sent to the laboratory as requested by the clinical team. IMPRESSION: Successful ultrasound-guided diagnostic and therapeutic paracentesis yielding 6.0 liters of peritoneal fluid. Read by: Brynda Greathouse PA-C Electronically Signed   By: Lucrezia Europe M.D.   On: 08/05/2018 13:05    Labs:  CBC: Recent Labs    02/08/18 1626 08/04/18 2027 08/10/18 0400  WBC 6.8 12.2* 6.1  HGB 13.9 9.7* 7.2*  HCT 40.6 29.3* 22.6*  PLT 256 254 104*    COAGS: Recent Labs    08/05/18 0555 08/10/18 0400  INR 1.42 1.47  APTT 33 34    BMP: Recent Labs    08/07/18 0506 08/08/18 0423 08/09/18 0853 08/10/18 0400  NA 132* 132* 129* 129*  K 3.9 3.4* 3.3* 3.7  CL 97* 96* 94* 96*  CO2 26 27 30 27   GLUCOSE 116* 107* 104* 102*  BUN 35* 35* 36* 35*  CALCIUM 8.1* 7.9* 7.7* 7.3*  CREATININE 1.32* 1.31* 1.34* 1.15  GFRNONAA 55* 56* 54* >60  GFRAA >60 >60 >60 >60    LIVER FUNCTION TESTS: Recent Labs     08/04/18 2027 08/06/18 0354  BILITOT 3.4* 2.1*  AST 94* 82*  ALT 39 38  ALKPHOS 83 71  PROT 6.0* 5.5*  ALBUMIN 2.4* 2.6*    TUMOR MARKERS: No results for input(s): AFPTM, CEA, CA199, CHROMGRNA in the last 8760 hours.  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  In addition, CT imaging demonstrates liver lesions that are highly concerning for hepatocellular carcinoma.  Patient presents with abdominal, scrotal and lower extremity swelling and has had 2 large volume paracentesis procedures.  Patient has been evaluated by oncology and palliative care and the patient is going to hospice care.  Patient needs management of his large volume ascites for palliative care.  A tunneled peritoneal catheter has been requested.  I discussed the tunneled peritoneal catheter with the patient in depth.  We discussed the procedure and long-term management.  The risks of the procedure include bleeding and infection.  Patient understands that cirrhotic patients in his situation are at increased risk of peritoneal infection but the benefit usually outweighs the small risk of infection in these situations.  Plan for image guided placement of a tunneled peritoneal catheter with moderate sedation.   Thank you for this interesting consult.  I  greatly enjoyed meeting Jontez Redfield and look forward to participating in their care.  A copy of this report was sent to the requesting provider on this date.  Electronically Signed: Burman Riis, MD 08/10/2018, 1:35 PM   I spent a total of 20 Minutes    in face to face in clinical consultation, greater than 50% of which was counseling/coordinating care for ascites management.

## 2018-08-10 NOTE — Care Management Important Message (Signed)
Copy of signed IM left with patient in room.  

## 2018-08-10 NOTE — Procedures (Signed)
  Pre-operative Diagnosis: Cirrhosis, liver lesions and ascites      Post-operative Diagnosis: Cirrhosis, liver lesions and ascites   Indications: Ascites management for palliative care  Procedure: Tunneled peritoneal catheter placement  Findings: Large volume of blood ascites.  Pleurx placed in peritoneal cavity and 1 liter of bloody fluid removed.  Complications: None     EBL: None  Plan: Pleurx drain is ready to use as needed.

## 2018-08-11 ENCOUNTER — Telehealth: Payer: Self-pay | Admitting: *Deleted

## 2018-08-11 ENCOUNTER — Inpatient Hospital Stay: Payer: Medicare Other | Admitting: Oncology

## 2018-08-11 LAB — BASIC METABOLIC PANEL
ANION GAP: 6 (ref 5–15)
BUN: 34 mg/dL — ABNORMAL HIGH (ref 8–23)
CHLORIDE: 95 mmol/L — AB (ref 98–111)
CO2: 26 mmol/L (ref 22–32)
Calcium: 7.5 mg/dL — ABNORMAL LOW (ref 8.9–10.3)
Creatinine, Ser: 1.18 mg/dL (ref 0.61–1.24)
Glucose, Bld: 153 mg/dL — ABNORMAL HIGH (ref 70–99)
POTASSIUM: 4.1 mmol/L (ref 3.5–5.1)
SODIUM: 127 mmol/L — AB (ref 135–145)

## 2018-08-11 LAB — HEMOGLOBIN: HEMOGLOBIN: 7.9 g/dL — AB (ref 13.0–17.0)

## 2018-08-11 MED ORDER — SPIRONOLACTONE 100 MG PO TABS: 100.0000 mg | ORAL_TABLET | Freq: Every day | ORAL | 1 refills | Status: AC

## 2018-08-11 MED ORDER — TRAMADOL HCL 50 MG PO TABS: 50.0000 mg | ORAL_TABLET | Freq: Four times a day (QID) | ORAL | 0 refills | Status: AC | PRN

## 2018-08-11 MED ORDER — CHLORPROMAZINE HCL 25 MG PO TABS: 25.0000 mg | ORAL_TABLET | Freq: Three times a day (TID) | ORAL | 0 refills | Status: AC | PRN

## 2018-08-11 MED ORDER — LACTULOSE 20 G PO PACK: 20.0000 g | PACK | Freq: Two times a day (BID) | ORAL | 0 refills | Status: AC

## 2018-08-11 MED ORDER — FUROSEMIDE 40 MG PO TABS: 40.0000 mg | ORAL_TABLET | Freq: Two times a day (BID) | ORAL | 0 refills | Status: AC

## 2018-08-11 NOTE — Discharge Summary (Addendum)
[facility] NAME: Robert Woodward    MR#:  [id number]  DATE OF BIRTH:  [birth year]  DATE OF ADMISSION:  08/04/2018 ADMITTING PHYSICIAN: Harrie Foreman, MD  DATE OF DISCHARGE: 08/11/2018  PRIMARY CARE PHYSICIAN: Patient, No Pcp Per    ADMISSION DIAGNOSIS:  Hyperbilirubinemia [E80.6] Hyponatremia [E87.1] Liver masses [R16.0] Elevated troponin [R79.89]  DISCHARGE DIAGNOSIS:  Active Problems:     Liver masses   Palliative care encounter   Ascites   SECONDARY DIAGNOSIS:  History reviewed. No pertinent past medical history.  HOSPITAL COURSE:  67 year old male with a EtOH dependence and tobacco dependence who presented to the hospital with anasarca.  1.  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  Patient had several paracentesis with 6 L of bloody ascitic fluid removed x3.  He now has a tunneled peritoneal catheter that was placed on October 9 with drainage of bloody ascites.  Patient will be discharged on oral Lasix and Aldactone. Sepsis was ruled out His cultures have been negative and therefore patient does not need antibiotics upon discharge.  2.  Liver masses: Possibly HCC.  Patient was seen and evaluated oncology.  Patient will have outpatient oncology follow-up.  Due to significant ascites liver biopsy is very challenging.  3.  Acute kidney injury due to dehydration: Creatinine remains stable  4.  Hyponatremia from underlying liver cirrhosis: This will need to be monitored closely while patient is on Lasix. 5. Tobacco dependence: Patient is encouraged to quit smoking. Counseling was provided for 4 minutes.  Patient has overall poor prognosis.  Palliative care consult was placed.  Patient would most benefit from hospice  services at discharge.   Please have hospice service drain Pleurx catheter as needed for patient comfort DISCHARGE CONDITIONS AND DIET:  Guarded condition on regular  diet  CONSULTS OBTAINED:  Treatment Team:  Sindy Guadeloupe, MD Herbert Pun, MD  DRUG ALLERGIES:   Allergies  Allergen Reactions  . Benadryl [Diphenhydramine] Rash  . Wool Alcohol [Lanolin] Rash    DISCHARGE MEDICATIONS:   Allergies as of 08/11/2018      Reactions   Benadryl [diphenhydramine] Rash   Wool Alcohol [lanolin] Rash      Medication List    STOP taking these medications   calcium carbonate 500 MG chewable tablet Commonly known as:  TUMS - dosed in mg elemental calcium     TAKE these medications   chlorproMAZINE 25 MG tablet Commonly known as:  THORAZINE Take 1 tablet (25 mg total) by mouth 3 (three) times daily as needed for hiccoughs.   furosemide 40 MG tablet Commonly known as:  LASIX Take 1 tablet (40 mg total) by mouth 2 (two) times daily.   lactulose 20 g packet Commonly known as:  CEPHULAC Take 1 packet (20 g total) by mouth 2 (two) times daily. Hold for loose stools   spironolactone 100 MG tablet Commonly known as:  ALDACTONE Take 1 tablet (100 mg total) by mouth daily.   traMADol 50 MG tablet Commonly known as:  ULTRAM Take 1 tablet (50 mg total) by mouth every 6 (six) hours as needed for moderate pain.         Today   CHIEF COMPLAINT:  Shortness of breath is improved.  Patient reports that his weight is stable   VITAL SIGNS:  Blood pressure 116/75, pulse 86, temperature 97.8 F (36.6 C), temperature source Oral, resp. rate 18, height 5\' 11"  (1.803 m), weight 93.5  kg, SpO2 95 %.   REVIEW OF SYSTEMS:  Review of Systems  Constitutional: Negative.  Negative for chills, fever and malaise/fatigue.  HENT: Negative.  Negative for ear discharge, ear pain, hearing loss, nosebleeds and sore throat.   Eyes: Negative.  Negative for blurred vision and pain.  Respiratory: Negative.  Negative for cough, hemoptysis, shortness of breath and wheezing.   Cardiovascular: Positive for leg swelling. Negative for chest pain and  palpitations.  Gastrointestinal: Positive for constipation. Negative for abdominal pain, blood in stool, diarrhea, nausea and vomiting.  Genitourinary: Negative.  Negative for dysuria.  Musculoskeletal: Negative.  Negative for back pain.  Skin: Negative.   Neurological: Negative for dizziness, tremors, speech change, focal weakness, seizures and headaches.  Endo/Heme/Allergies: Negative.  Does not bruise/bleed easily.  Psychiatric/Behavioral: Negative.  Negative for depression, hallucinations and suicidal ideas.     PHYSICAL EXAMINATION:  GENERAL:  67 y.o.-year-old patient lying in the bed with no acute distress.  NECK:  Supple, no jugular venous distention. No thyroid enlargement, no tenderness.  LUNGS: Normal breath sounds bilaterally, no wheezing, rales,rhonchi  No use of accessory muscles of respiration.  CARDIOVASCULAR: S1, S2 normal. No murmurs, rubs, or gallops.  ABDOMEN: Soft, non-tender, mildly distended. Bowel sounds present. No organomegaly or mass.  Catheter placed EXTREMITIES: 3+ lower extremity edema, no cyanosis, or clubbing.  PSYCHIATRIC: The patient is alert and oriented x 3.  SKIN: No obvious rash, lesion, or ulcer.   DATA REVIEW:   CBC Recent Labs  Lab 08/10/18 0400 08/11/18 0522  WBC 6.1  --   HGB 7.2* 7.9*  HCT 22.6*  --   PLT 104*  --     Chemistries  Recent Labs  Lab 08/06/18 0354  08/09/18 0853  08/11/18 0522  NA 134*   < > 129*   < > 127*  K 3.5   < > 3.3*   < > 4.1  CL 100   < > 94*   < > 95*  CO2 27   < > 30   < > 26  GLUCOSE 125*   < > 104*   < > 153*  BUN 29*   < > 36*   < > 34*  CREATININE 1.37*   < > 1.34*   < > 1.18  CALCIUM 8.0*   < > 7.7*   < > 7.5*  MG  --    < > 1.8  --   --   AST 82*  --   --   --   --   ALT 38  --   --   --   --   ALKPHOS 71  --   --   --   --   BILITOT 2.1*  --   --   --   --    < > = values in this interval not displayed.    Cardiac Enzymes Recent Labs  Lab 08/05/18 0555 08/05/18 1321 08/05/18 1630   TROPONINI <0.03 0.04* 0.03*    Microbiology Results  @[email or web address] @  RADIOLOGY:  [place] Intraoperative  Result Date: 08/10/2018 CLINICAL DATA:  Ultrasound was provided for use by the ordering physician, and a technical charge was applied by the performing facility.  No radiologist interpretation/professional services rendered.   Ir Image Guided Drainage By Percutaneous Catheter  Result Date: 08/10/2018 INDICATION: 67 year old with cirrhosis, liver lesions and large volume ascites. Patient is going to hospice care and needs management for the ascites. Plan for placement of a tunneled peritoneal catheter.  EXAM: PLACEMENT OF TUNNELED PERITONEAL CATHETER WITH ULTRASOUND AND FLUOROSCOPIC GUIDANCE MEDICATIONS: Ancef 2 g ANESTHESIA/SEDATION: Fentanyl 50 mcg IV; Versed 1.5 mg IV Moderate Sedation Time:  27 minutes The patient was continuously monitored during the procedure by the interventional radiology nurse under my direct supervision. COMPLICATIONS: None immediate. PROCEDURE: Informed written consent was obtained from the patient after a thorough discussion of the procedural risks, benefits and alternatives. All questions were addressed. A timeout was performed prior to the initiation of the procedure. Patient was placed supine on the interventional table. A large amount of volume was identified in the right lower quadrant with ultrasound. The right side of the abdomen was prepped and draped in sterile fashion. Maximal barrier sterile technique was utilized including caps, mask, sterile gowns, sterile gloves, sterile drape, hand hygiene and skin antiseptic. Two areas on the right abdomen were anesthetized with 1% lidocaine and 2 small incisions were made. A PleurX catheter was tunneled between these incisions. A needle was directed into the peritoneal space with ultrasound guidance along the lateral incision. Bloody ascites was aspirated. A wire was advanced in the peritoneal cavity and confirmed with  ultrasound and fluoroscopy. The peritoneal access tract was dilated and a peel-away sheath was placed. The PleurX catheter was advanced through the peel-away sheath into the peritoneal cavity without difficulty. The peritoneal access site was closed using absorbable suture and Dermabond. The catheter was sutured to skin with Prolene suture. The cuff was placed underneath the skin. Total of 6 L of bloody ascites was removed. FINDINGS: Large volume of ascites. 6 L of bloody ascites was removed. Catheter confirmed within the peritoneal space. IMPRESSION: Successful placement of a tunneled peritoneal catheter. Catheter is ready to be used. Electronically Signed   By: Markus Daft M.D.   On: 08/10/2018 16:04      Allergies as of 08/11/2018      Reactions   Benadryl [diphenhydramine] Rash   Wool Alcohol [lanolin] Rash      Medication List    STOP taking these medications   calcium carbonate 500 MG chewable tablet Commonly known as:  TUMS - dosed in mg elemental calcium     TAKE these medications   chlorproMAZINE 25 MG tablet Commonly known as:  THORAZINE Take 1 tablet (25 mg total) by mouth 3 (three) times daily as needed for hiccoughs.   furosemide 40 MG tablet Commonly known as:  LASIX Take 1 tablet (40 mg total) by mouth 2 (two) times daily.   lactulose 20 g packet Commonly known as:  CEPHULAC Take 1 packet (20 g total) by mouth 2 (two) times daily. Hold for loose stools   spironolactone 100 MG tablet Commonly known as:  ALDACTONE Take 1 tablet (100 mg total) by mouth daily.   traMADol 50 MG tablet Commonly known as:  ULTRAM Take 1 tablet (50 mg total) by mouth every 6 (six) hours as needed for moderate pain.        Management plans discussed with the patient and he is in agreement. Stable for discharge   Patient should follow up with dr Janese Banks He will need PCP CODE STATUS:     Code Status Orders  (From admission, onward)         Start     Ordered   08/05/18 1729  Do  not attempt resuscitation (DNR)  Continuous    Question Answer Comment  In the event of cardiac or respiratory ARREST Do not call a "code blue"   In the event of  cardiac or respiratory ARREST Do not perform Intubation, CPR, defibrillation or ACLS   In the event of cardiac or respiratory ARREST Use medication by any route, position, wound care, and other measures to relive pain and suffering. May use oxygen, suction and manual treatment of airway obstruction as needed for comfort.      08/05/18 1731        Code Status History    Date Active Date Inactive Code Status Order ID Comments User Context   08/05/2018 0433 08/05/2018 1731 Full Code [id number]  Harrie Foreman, MD Inpatient      TOTAL TIME TAKING CARE OF THIS PATIENT: 38 minutes.    Note: This dictation was prepared with Dragon dictation along with smaller phrase technology. Any transcriptional errors that result from this process are unintentional.  Jasiah Elsen M.D on 08/11/2018 at 8:59 AM  Between 7am to 6pm - Pager - [phone] After 6pm go to [email or web address] - password [email or web address] [facility]  Office  [phone]  CC: Primary care physician; Patient, No Pcp Per

## 2018-08-11 NOTE — Progress Notes (Signed)
Follow up visit made to new referral for [facility] services at home. Patient seen sitting up in bed, finished breakfast, alert and interactive. His abdomen still appears distended despite paracentesis with 6 liters drained yesterday.  Discussed possible draining prior to discharge.Engineer, technical sales did explain to the patient that the hospice RN will be draining his Pleurex at home. He voiced understanding. Plan is for discharge home today after delivery of the hospital bed is delivered. Patient will require EMS transport home. [place] aware. Signed DNR and prescriptions in place in discharge packet. Discharge summary faxed to referral. Flo Shanks RN, BSN, [facility] and [facility], hospital Liaison [phone]

## 2018-08-11 NOTE — Care Management (Signed)
EMS packet completed with DNR. RNCM spoke with sister and he will be going to [address] [place] [address]. RN will obtain 5-1 liter PleurX bottles for patient to take home.  MD notified of need for home RN order PleurX care and drainage frequency. Sister will call unit when hospice bed has been delivered to stated address. No other RNCM needs.

## 2018-08-11 NOTE — Progress Notes (Signed)
Called patient sister Santiago Glad and husband Gwyndolyn Saxon to notify that EMS is en route. No answer. Left voicemail for sister Santiago Glad notifying of EMS arrival.

## 2018-08-11 NOTE — Progress Notes (Signed)
[place]  [phone] [phone]   Name: Robert Woodward Date: 08/11/2018 MRN: [id number]  DOB: 11-Jul-1951  Patient Care Team: Patient, No Pcp Per as PCP - General (General Practice)    REASON FOR CONSULTATION: Palliative Care consult requested for this 67 y.o. male with history of chronic alcohol abuse since age 67 (stopped heavy drinking 2 years ago), who was admitted on 08/05/2018 with abdominal pain, anasarca, and possible sepsis.  Work-up including abdominal CT revealed cirrhosis and multiple hepatic masses concerning for metastatic disease or multifocal hepatocellular carcinoma.  Patient was also noted to have ascites and is status post large-volume paracentesis with 6 L of ascitic fluid drained.  Patient has been seen in consultation by oncology.  Palliative care has also been requested to help establish medical goals.    CODE STATUS: DNR  PAST MEDICAL HISTORY:History reviewed. No pertinent past medical history.  PAST SURGICAL HISTORY:  Past Surgical History:  Procedure Laterality Date  . ANKLE RECONSTRUCTION      HEMATOLOGY/ONCOLOGY HISTORY:   No history exists.    ALLERGIES:  is allergic to benadryl [diphenhydramine] and wool alcohol [lanolin].  MEDICATIONS:  Current Facility-Administered Medications  Medication Dose Route Frequency Provider Last Rate Last Dose  . 0.9 %  sodium chloride infusion   Intravenous Continuous Markus Daft, MD 10 mL/hr at 08/10/18 1202    . acetaminophen (TYLENOL) tablet 650 mg  650 mg Oral Q6H PRN Harrie Foreman, MD   650 mg at 08/08/18 1154   Or  . acetaminophen (TYLENOL) suppository 650 mg  650 mg Rectal Q6H PRN Harrie Foreman, MD      . bisacodyl (DULCOLAX) EC tablet 5 mg  5 mg Oral Daily PRN Demetrios Loll, MD      . calcium carbonate (TUMS - dosed in mg elemental calcium) chewable tablet 200 mg of elemental calcium  1 tablet Oral PRN Harrie Foreman, MD      .  chlorproMAZINE (THORAZINE) tablet 25 mg  25 mg Oral TID PRN Lance Coon, MD   25 mg at 08/09/18 2225  . ciprofloxacin (CIPRO) tablet 500 mg  500 mg Oral Q breakfast Demetrios Loll, MD   500 mg at 08/11/18 1107  . cyclobenzaprine (FLEXERIL) tablet 5 mg  5 mg Oral TID PRN Demetrios Loll, MD      . docusate sodium (COLACE) capsule 100 mg  100 mg Oral BID Harrie Foreman, MD   100 mg at 08/11/18 1106  . furosemide (LASIX) injection 20 mg  20 mg Intravenous Q12H Demetrios Loll, MD   20 mg at 08/11/18 0549  . iopamidol (ISOVUE-300) 61 % injection 30 mL  30 mL Intravenous Once PRN Markus Daft, MD      . metoCLOPramide (REGLAN) injection 10 mg  10 mg Intravenous Q8H PRN Demetrios Loll, MD   10 mg at 08/06/18 1031  . morphine 4 MG/ML injection 4 mg  4 mg Intravenous Q4H PRN Demetrios Loll, MD      . ondansetron [facility]) tablet 4 mg  4 mg Oral Q6H PRN Harrie Foreman, MD       Or  . ondansetron [facility]) injection 4 mg  4 mg Intravenous Q6H PRN Harrie Foreman, MD   4 mg at 08/07/18 1545  . pantoprazole (PROTONIX) EC tablet 40 mg  40 mg Oral Daily [place] Mcphee, Kirt Boys, NP   40 mg at 08/11/18 1106  . potassium chloride SA (K-DUR,KLOR-CON) CR tablet 40 mEq  40 mEq Oral Daily Demetrios Loll, MD   40 mEq at 08/11/18 1106  . sodium chloride flush (NS) 0.9 % injection 3 mL  3 mL Intravenous Q12H Demetrios Loll, MD   3 mL at 08/11/18 1107  . sodium chloride flush (NS) 0.9 % injection 3 mL  3 mL Intravenous PRN Demetrios Loll, MD   3 mL at 08/08/18 1819  . spironolactone (ALDACTONE) tablet 100 mg  100 mg Oral Daily Harrie Foreman, MD   100 mg at 08/11/18 1106  . traMADol (ULTRAM) tablet 50 mg  50 mg Oral Q6H PRN Demetrios Loll, MD   50 mg at 08/11/18 1529    VITAL SIGNS: BP 98/68 (BP Location: Right Arm)   Pulse 96   Temp 97.8 F (36.6 C) (Oral)   Resp 18   Ht 5\' 11"  (1.803 m)   Wt 206 lb 2 oz (93.5 kg)   SpO2 99%   BMI 28.75 kg/m  Filed Weights   08/10/18 1250 08/10/18 1933 08/11/18 0500  Weight: 238 lb 5.1 oz (108.1 kg) 227  lb 11.8 oz (103.3 kg) 206 lb 2 oz (93.5 kg)    Estimated body mass index is 28.75 kg/m as calculated from the following:   Height as of this encounter: 5\' 11"  (1.803 m).   Weight as of this encounter: 206 lb 2 oz (93.5 kg).  LABS: CBC:    Component Value Date/Time   WBC 6.1 08/10/2018 0400   HGB 7.9 (L) 08/11/2018 0522   HCT 22.6 (L) 08/10/2018 0400   PLT 104 (L) 08/10/2018 0400   MCV 90.8 08/10/2018 0400   Comprehensive Metabolic Panel:    Component Value Date/Time   NA 127 (L) 08/11/2018 0522   K 4.1 08/11/2018 0522   CL 95 (L) 08/11/2018 0522   CO2 26 08/11/2018 0522   BUN 34 (H) 08/11/2018 0522   CREATININE 1.18 08/11/2018 0522   GLUCOSE 153 (H) 08/11/2018 0522   CALCIUM 7.5 (L) 08/11/2018 0522   AST 82 (H) 08/06/2018 0354   ALT 38 08/06/2018 0354   ALKPHOS 71 08/06/2018 0354   BILITOT 2.1 (H) 08/06/2018 0354   PROT 5.5 (L) 08/06/2018 0354   ALBUMIN 2.6 (L) 08/06/2018 0354    RADIOGRAPHIC STUDIES: Dg Chest 2 View  Result Date: 08/04/2018 CLINICAL DATA:  Abdominal distension and diffuse body edema for the past 3 weeks. Dyspnea on exertion. EXAM: CHEST - 2 VIEW COMPARISON:  02/08/2018. FINDINGS: Normal sized heart. Clear lungs. Diffuse osteopenia. Mild thoracic spine degenerative changes. IMPRESSION: No acute abnormality. Electronically Signed   By: Claudie Revering M.D.   On: 08/04/2018 21:04   Ct Chest W Contrast  Result Date: 08/05/2018 CLINICAL DATA:  Liver cancer staging. Tightness and pain in the abdomen. EXAM: CT CHEST WITH CONTRAST TECHNIQUE: Multidetector CT imaging of the chest was performed during intravenous contrast administration. CONTRAST:  70mL OMNIPAQUE IOHEXOL 300 MG/ML  SOLN COMPARISON:  CT abdomen pelvis 08/04/2018. FINDINGS: Cardiovascular: Atherosclerotic calcification of the arterial vasculature, including three-vessel involvement of the coronary arteries. Heart size normal. No pericardial effusion. Mediastinum/Nodes: Triangular-shaped prevascular soft  tissue lesion may represent thymus, measuring 1.5 cm in short axis. Additional mediastinal and hilar lymph nodes are subcentimeter in short axis size. No hilar or axillary adenopathy. There may be distal esophageal wall thickening. Lungs/Pleura: Minimal mucoid impaction in the left lower lobe. Lungs are otherwise clear. No pleural fluid. Adherent debris in the trachea. Upper Abdomen: Liver is shrunken and irregular and contains heterogeneous masses, better visualized  on yesterday's exam. Upper abdominal lymph nodes measure up to 1.2 cm in the portacaval station. Gallstones. Visualized portions of the adrenal glands, kidneys, spleen, pancreas, stomach and bowel are otherwise grossly unremarkable. Large ascites. Musculoskeletal: Degenerative changes in the spine. Patchy sclerosis in the lateral aspects of the seventh ribs bilaterally, which may be posttraumatic in etiology, given symmetry. Additional age indeterminate fractures are seen in the right posterolateral eighth and ninth ribs. IMPRESSION: 1. No evidence of primary malignancy or metastatic disease in the chest. 2. Possible hyperplastic thymic tissue in the prevascular space. Adenopathy is not excluded. Continued attention on follow-up exams is warranted. 3. Cirrhosis with heterogeneous hepatic masses, better seen on 08/04/2018. 4. Large ascites. 5. Aortic atherosclerosis (ICD10-170.0). Three-vessel coronary artery calcification. 6. Cholelithiasis. Electronically Signed   By: Lorin Picket M.D.   On: 08/05/2018 12:48   Mr Abdomen W Wo Contrast  Addendum Date: 08/05/2018   ADDENDUM REPORT: 08/05/2018 14:52 ADDENDUM: The large enhancing lesions have early arterial enhancement and washout typical of hepatocellular carcinoma (LI-RADS 5 categorization). Electronically Signed   By: Suzy Bouchard M.D.   On: 08/05/2018 14:52   Result Date: 08/05/2018 CLINICAL DATA:  Hepatic masses. EXAM: MRI ABDOMEN WITHOUT AND WITH CONTRAST TECHNIQUE: Multiplanar  multisequence MR imaging of the abdomen was performed both before and after the administration of intravenous contrast. CONTRAST:  7 mL Gadavist COMPARISON:  CT 08/04/2018 FINDINGS: Lower chest:  Lung bases are clear. Hepatobiliary: Multiple round masses in the liver are solid-appearing and demonstrate uniform arterial enhancement. Largest lesion the RIGHT hepatic lobe measures 8 cm (image 21/13). Smaller partially exophytic lesion from the posterolateral RIGHT hepatic lobe measures 3.7 cm. There is lesion in the LEFT lateral hepatic lobe measuring 6.0 cm. Large lesion in the posterior RIGHT lobe liver measuring 7.7 cm. Underlying liver itself is shrunken. There is nodular contour to the liver. The portal veins are patent. There is a large volume of intraperitoneal free fluid suggesting ascites. No biliary duct dilatation. Pancreas: No pancreatic lesion.  No pancreatic ductal dilatation. Spleen: Spleen is normal volume. Adrenals/urinary tract: Adrenal glands and kidneys are normal. Stomach/Bowel: Stomach and limited of the small bowel is unremarkable Vascular/Lymphatic: Abdominal aortic normal caliber. No retroperitoneal periportal lymphadenopathy. Musculoskeletal: No aggressive osseous lesion. Schmorl's node at L1. IMPRESSION: 1. Large round enhancing lesions on the background cirrhotic morphology of the liver is most suggestive of multifocal hepatocellular carcinoma. 2. Large volume of intraperitoneal free fluid ascites associated with liver dysfunction. 3. Portal veins are patent.  Spleen normal volume. Electronically Signed: By: Suzy Bouchard M.D. On: 08/05/2018 13:11   [place] Intraoperative  Result Date: 08/10/2018 CLINICAL DATA:  Ultrasound was provided for use by the ordering physician, and a technical charge was applied by the performing facility.  No radiologist interpretation/professional services rendered.   Ct Abdomen Pelvis W Contrast  Result Date: 08/04/2018 CLINICAL DATA:  Abdominal distension  with swelling EXAM: CT ABDOMEN AND PELVIS WITH CONTRAST TECHNIQUE: Multidetector CT imaging of the abdomen and pelvis was performed using the standard protocol following bolus administration of intravenous contrast. CONTRAST:  143mL ISOVUE-300 IOPAMIDOL (ISOVUE-300) INJECTION 61% COMPARISON:  None. FINDINGS: Lower chest: Lung bases demonstrate no acute consolidation or pleural effusion. The heart size is within normal limits. Small moderate hiatal hernia with fluid in the hernia sac. Hepatobiliary: Nodular liver contour consistent with cirrhosis. Multiple suspicious liver masses measuring up to 8.6 cm in size. Calcified gallstones. No biliary dilatation Pancreas: Unremarkable. No pancreatic ductal dilatation or surrounding inflammatory changes. Spleen: Enlarged,  coronal measurement of 15.5 cm. Vague hypodensity within the posterior spleen. Adrenals/Urinary Tract: Adrenal glands are within normal limits. No hydronephrosis. Bladder within normal limits. Stomach/Bowel: No bowel dilatation or bowel wall thickening. Centralization of bowel loops. Negative appendix. Vascular/Lymphatic: Moderate aortic atherosclerosis. No aneurysmal dilatation. Subcentimeter gastrohepatic lymph nodes. Reproductive: Prostate is unremarkable. Other: No free air. Massive ascites in the abdomen and pelvis. Generalized subcutaneous edema Musculoskeletal: Chronic appearing superior endplate deformity at L1 with Schmorl's node. No suspicious bone lesion. IMPRESSION: 1. 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 2. Massive quantity of ascites within the abdomen and pelvis 3. Splenomegaly. Vague hypodensity in the posterior spleen, not sure if this is due to heterogeneous enhancement or a vague hypodense mass in the spleen. 4. Gallstones 5. Anasarca Electronically Signed   By: Donavan Foil M.D.   On: 08/04/2018 22:20   US Paracentesis  Result Date:  08/08/2018 INDICATION: 67 year old with ascites due to alcoholic cirrhosis. Maximum amount is 6 L. Prior paracentesis on 08/05/2018. EXAM: ULTRASOUND GUIDED PARACENTESIS MEDICATIONS: None. COMPLICATIONS: None immediate. PROCEDURE: Informed written consent was obtained from the patient after a discussion of the risks, benefits and alternatives to treatment. A timeout was performed prior to the initiation of the procedure. Initial ultrasound scanning demonstrates a large amount of ascites within the right lower abdominal quadrant. The right lower abdomen was prepped and draped in the usual sterile fashion. 1% lidocaine was used for local anesthesia. Following this, a 6 Fr Safe-T-Centesis catheter was introduced. An ultrasound image was saved for documentation purposes. The paracentesis was performed. The catheter was removed and a dressing was applied. The patient tolerated the procedure well without immediate post procedural complication. FINDINGS: A total of approximately 6 L of bloody fluid was removed. Large amount of residual ascites after the 6 L were removed. IMPRESSION: Successful ultrasound-guided paracentesis yielding 6 liters of peritoneal fluid. Electronically Signed   By: Markus Daft M.D.   On: 08/08/2018 13:08   US Paracentesis  Result Date: 08/05/2018 INDICATION: Patient with abdominal distention, ascites. Request is made for diagnostic and therapeutic paracentesis. EXAM: ULTRASOUND GUIDED DIAGNOSTIC AND THERAPEUTIC PARACENTESIS MEDICATIONS: 10 mL 1% lidocaine COMPLICATIONS: None immediate. PROCEDURE: Informed written consent was obtained from the patient after a discussion of the risks, benefits and alternatives to treatment. A timeout was performed prior to the initiation of the procedure. Initial ultrasound scanning demonstrates a large amount of ascites within the right lower abdominal quadrant. The right lower abdomen was prepped and draped in the usual sterile fashion. 1% lidocaine was used for  local anesthesia. Following this, a 6 Fr Safe-T-Centesis catheter was introduced. An ultrasound image was saved for documentation purposes. The paracentesis was performed. The catheter was removed and a dressing was applied. The patient tolerated the procedure well without immediate post procedural complication. FINDINGS: A total of approximately 6.0 liters of bloody fluid was removed. Samples were sent to the laboratory as requested by the clinical team. IMPRESSION: Successful ultrasound-guided diagnostic and therapeutic paracentesis yielding 6.0 liters of peritoneal fluid. Read by: Brynda Greathouse PA-C Electronically Signed   By: Lucrezia Europe M.D.   On: 08/05/2018 13:05   Ir Image Guided Drainage By Percutaneous Catheter  Result Date: 08/10/2018 INDICATION: 67 year old with cirrhosis, liver lesions and large volume ascites. Patient is going to hospice care and needs management for the ascites. Plan for placement of a tunneled peritoneal catheter. EXAM: PLACEMENT OF TUNNELED PERITONEAL CATHETER WITH ULTRASOUND AND FLUOROSCOPIC GUIDANCE MEDICATIONS: Ancef  2 g ANESTHESIA/SEDATION: Fentanyl 50 mcg IV; Versed 1.5 mg IV Moderate Sedation Time:  27 minutes The patient was continuously monitored during the procedure by the interventional radiology nurse under my direct supervision. COMPLICATIONS: None immediate. PROCEDURE: Informed written consent was obtained from the patient after a thorough discussion of the procedural risks, benefits and alternatives. All questions were addressed. A timeout was performed prior to the initiation of the procedure. Patient was placed supine on the interventional table. A large amount of volume was identified in the right lower quadrant with ultrasound. The right side of the abdomen was prepped and draped in sterile fashion. Maximal barrier sterile technique was utilized including caps, mask, sterile gowns, sterile gloves, sterile drape, hand hygiene and skin antiseptic. Two areas on the  right abdomen were anesthetized with 1% lidocaine and 2 small incisions were made. A PleurX catheter was tunneled between these incisions. A needle was directed into the peritoneal space with ultrasound guidance along the lateral incision. Bloody ascites was aspirated. A wire was advanced in the peritoneal cavity and confirmed with ultrasound and fluoroscopy. The peritoneal access tract was dilated and a peel-away sheath was placed. The PleurX catheter was advanced through the peel-away sheath into the peritoneal cavity without difficulty. The peritoneal access site was closed using absorbable suture and Dermabond. The catheter was sutured to skin with Prolene suture. The cuff was placed underneath the skin. Total of 6 L of bloody ascites was removed. FINDINGS: Large volume of ascites. 6 L of bloody ascites was removed. Catheter confirmed within the peritoneal space. IMPRESSION: Successful placement of a tunneled peritoneal catheter. Catheter is ready to be used. Electronically Signed   By: Markus Daft M.D.   On: 08/10/2018 16:04    PERFORMANCE STATUS (ECOG) : 4 - Bedbound  Review of Systems Hiccups, Otherwise, a complete review of systems is negative.  Physical Exam General: frail appearing, NAD Lungs: Clear to auscultation bilaterally.  Heart: Regular rate and rhythm. Abdomen: nontender, distended, +fluid wave Musculoskeletal: + edema Neuro: Alert, answering all questions appropriately. Cranial nerves grossly intact.  IMPRESSION: Patient is comfortable and says he is ready to go home. He has no acute complaints. Plan is for him to discharge later today with hospice.     PLAN: Home with hospice   Patient expressed understanding and was in agreement with this plan.    Time Total: 15 minutes  Visit consisted of counseling and education dealing with the complex and emotionally intense issues of symptom management and palliative care in the setting of serious and potentially life-threatening  illness.Greater than 50%  of this time was spent counseling and coordinating care related to the above assessment and plan.  Signed by: Altha Harm, [place], NP-C, [place] (Work Cell)

## 2018-09-02 DEATH — deceased

## 2019-01-02 IMAGING — US US PARACENTESIS
1 series · 9 of 9 positions shown · non-contrast
Comparison: none

INDICATION: 67-year-old with ascites due to alcoholic cirrhosis. Maximum amount
is 6 L. Prior paracentesis on 08/05/2018.

[Series 1: us paracentesis · 0.26mm/px · 9 of 9 slices shown]
[im 1/9]
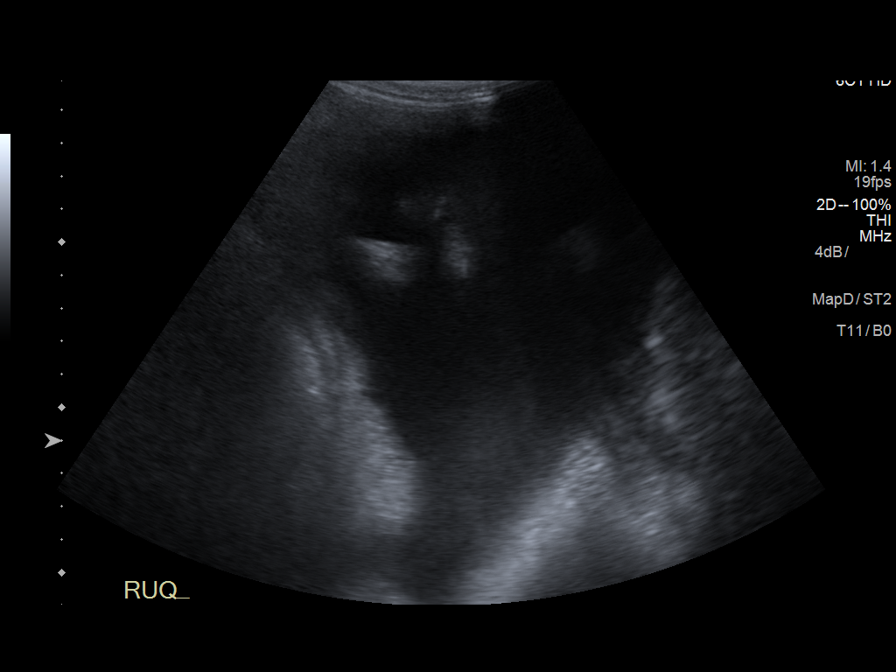
[im 2/9]
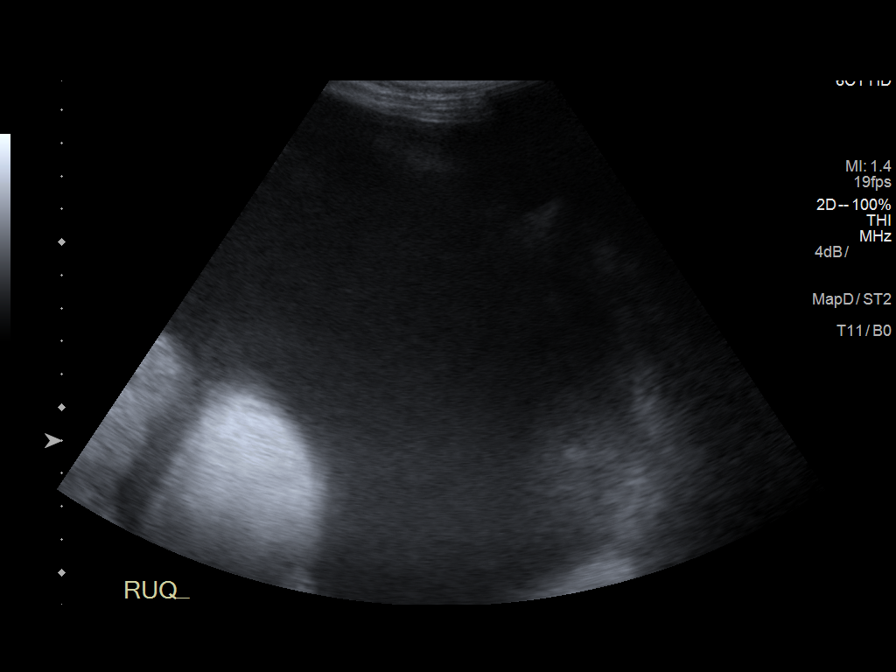
[im 3/9]
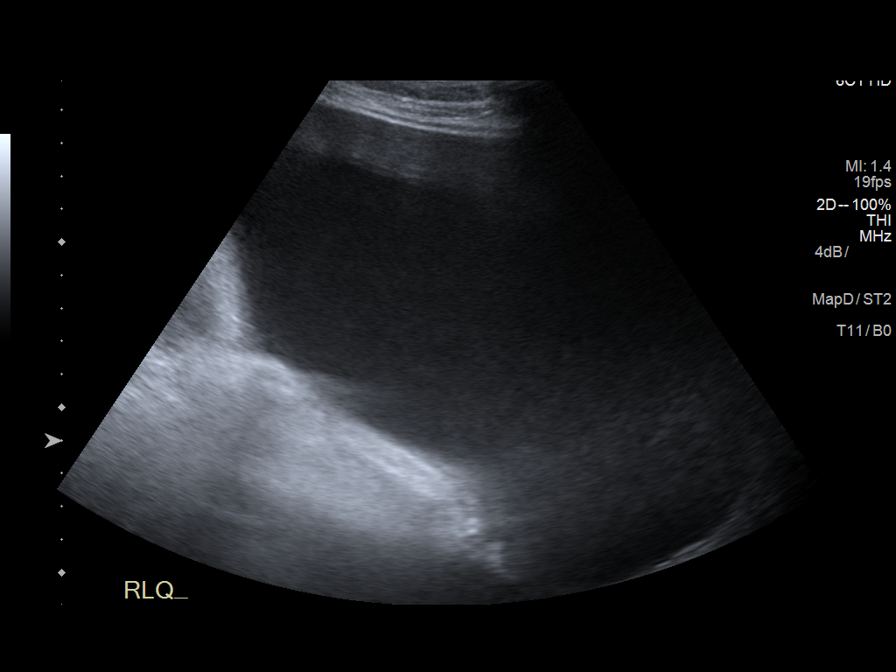
[im 4/9]
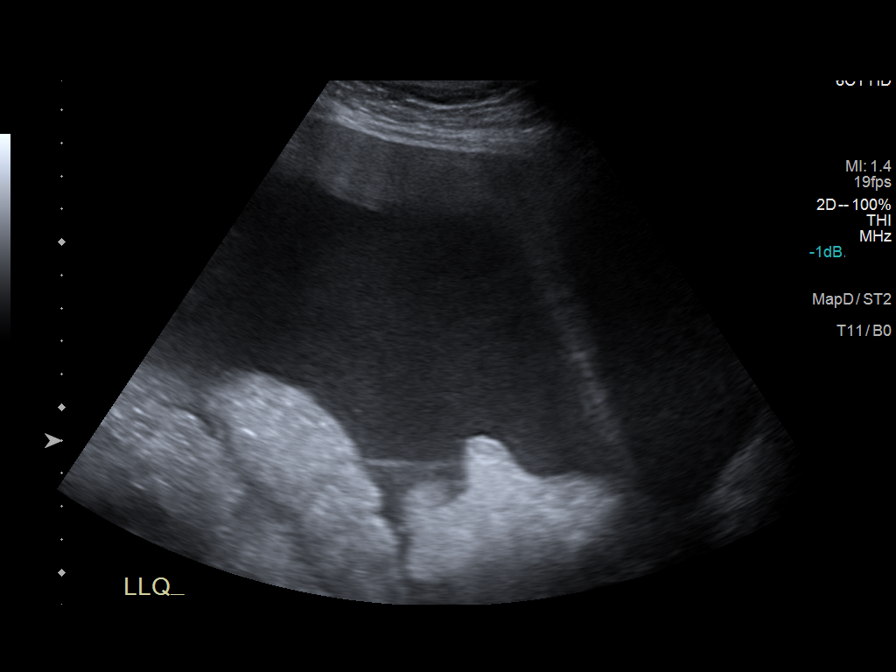
[im 5/9]
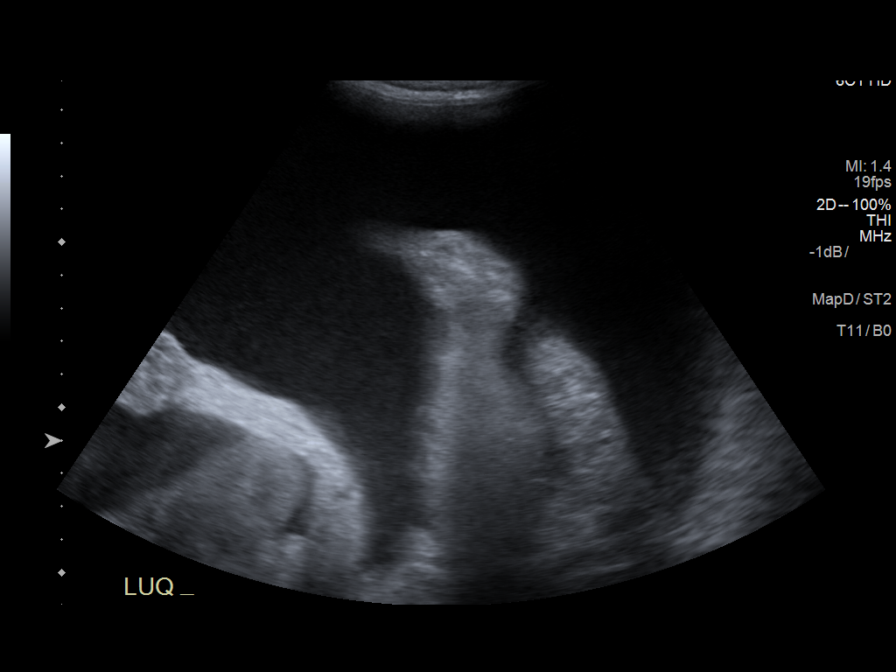
[im 6/9]
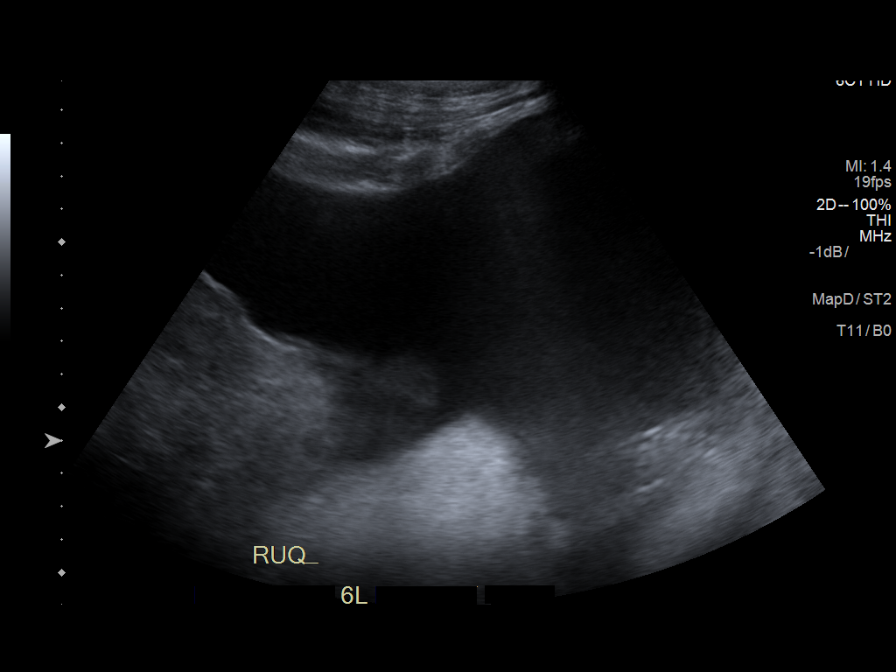
[im 7/9]
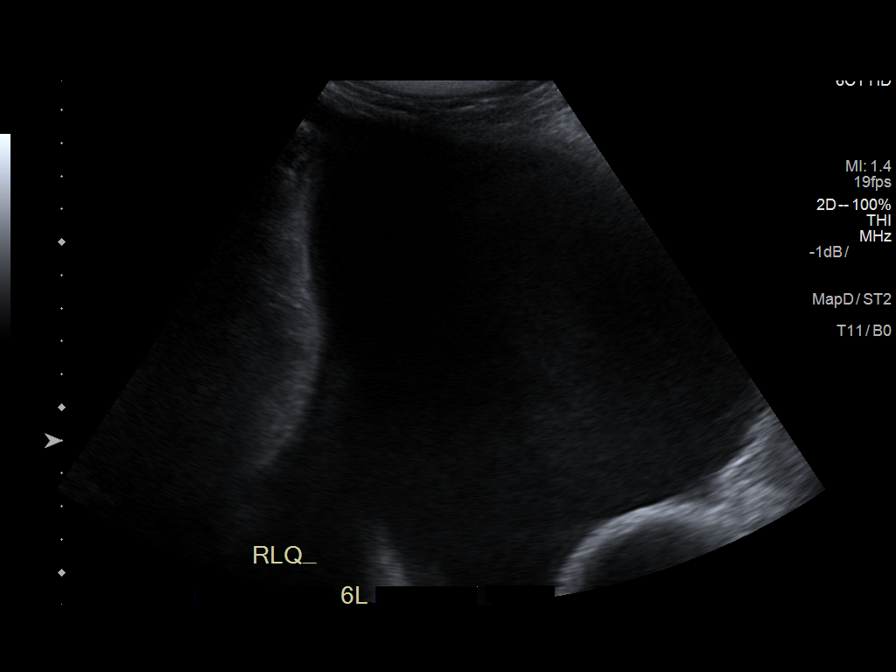
[im 8/9]
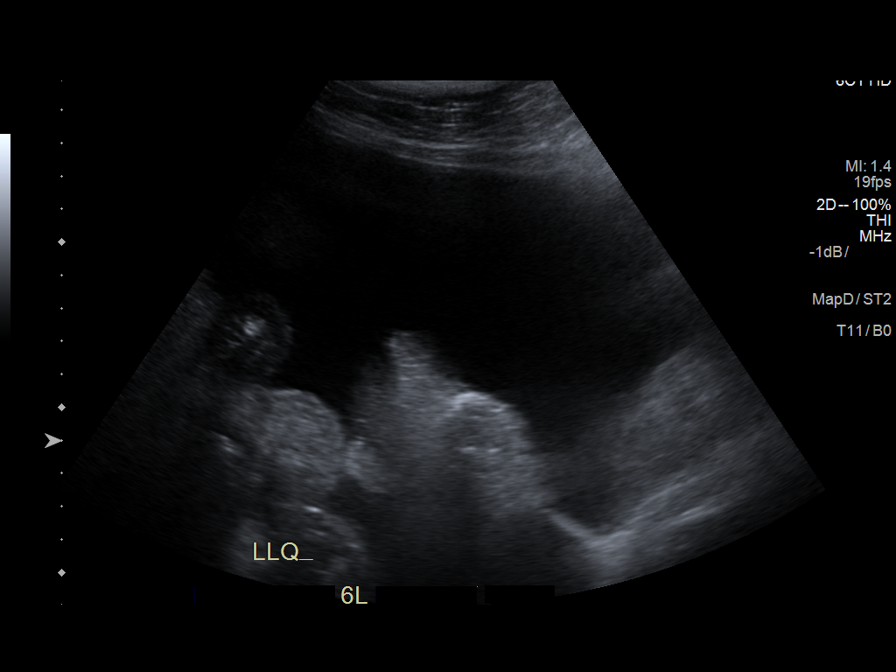
[im 9/9]
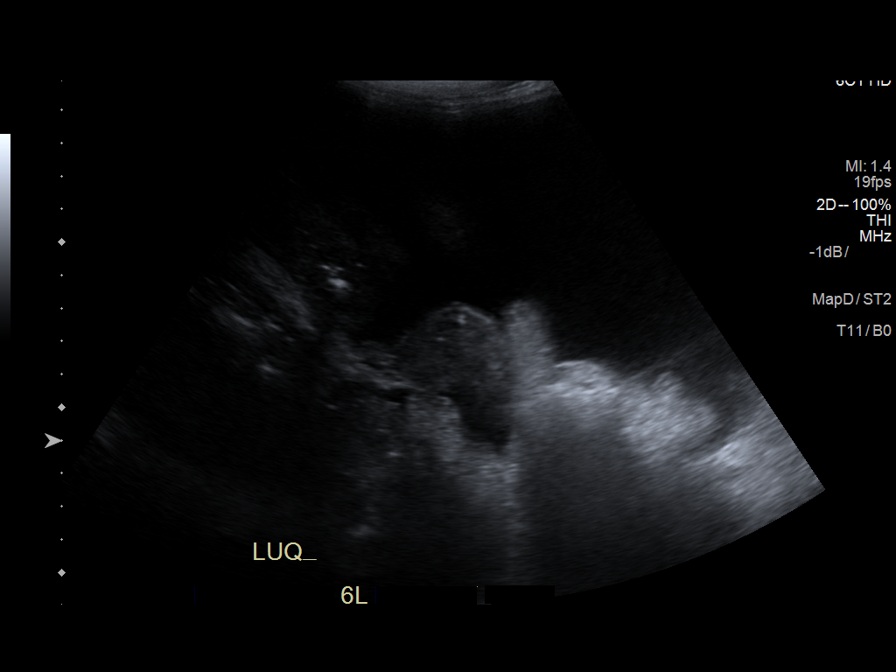

[9 of 9 positions shown; findings below may reference images not displayed]

EXAM:
ULTRASOUND GUIDED PARACENTESIS

MEDICATIONS:
None.

COMPLICATIONS:
None immediate.

PROCEDURE:
Informed written consent was obtained from the patient after a
discussion of the risks, benefits and alternatives to treatment. A
timeout was performed prior to the initiation of the procedure.

Initial ultrasound scanning demonstrates a large amount of ascites
within the right lower abdominal quadrant. The right lower abdomen
was prepped and draped in the usual sterile fashion. 1% lidocaine
was used for local anesthesia.

Following this, a 6 Fr Safe-T-Centesis catheter was introduced. An
ultrasound image was saved for documentation purposes. The
paracentesis was performed. The catheter was removed and a dressing
was applied. The patient tolerated the procedure well without
immediate post procedural complication.
FINDINGS: A total of approximately 6 L of bloody fluid was removed. Large
amount of residual ascites after the 6 L were removed.
IMPRESSION: Successful ultrasound-guided paracentesis yielding 6 liters of
peritoneal fluid.
